# Patient Record
Sex: Male | Born: 1985 | Race: White | Hispanic: Yes | Marital: Married | Smoking: Former smoker
Health system: Southern US, Community
[De-identification: ages and names within clinical notes are randomized; demographics above are authoritative.]

---

## 2006-08-13 ENCOUNTER — Emergency Department (HOSPITAL_COMMUNITY): Admission: EM | Admit: 2006-08-13 | Discharge: 2006-08-14 | Payer: Self-pay | Admitting: Emergency Medicine

## 2006-09-16 ENCOUNTER — Inpatient Hospital Stay (HOSPITAL_COMMUNITY): Admission: EM | Admit: 2006-09-16 | Discharge: 2006-09-20 | Payer: Self-pay | Admitting: Emergency Medicine

## 2006-11-07 ENCOUNTER — Inpatient Hospital Stay (HOSPITAL_COMMUNITY): Admission: AD | Admit: 2006-11-07 | Discharge: 2006-11-10 | Payer: Self-pay | Admitting: Psychiatry

## 2006-11-07 ENCOUNTER — Ambulatory Visit: Payer: Self-pay | Admitting: Psychiatry

## 2009-03-08 ENCOUNTER — Emergency Department (HOSPITAL_COMMUNITY): Admission: EM | Admit: 2009-03-08 | Discharge: 2009-03-08 | Payer: Self-pay | Admitting: *Deleted

## 2010-05-11 IMAGING — CR DG ANKLE COMPLETE 3+V*L*
3 series · 3 of 3 positions shown · non-contrast
Comparison: None.

CLINICAL DATA: 22-year-old male status post twisting injury with
lateral ankle and foot pain.

LEFT ANKLE COMPLETE - 3+ VIEW

[t ankle joint ap left]
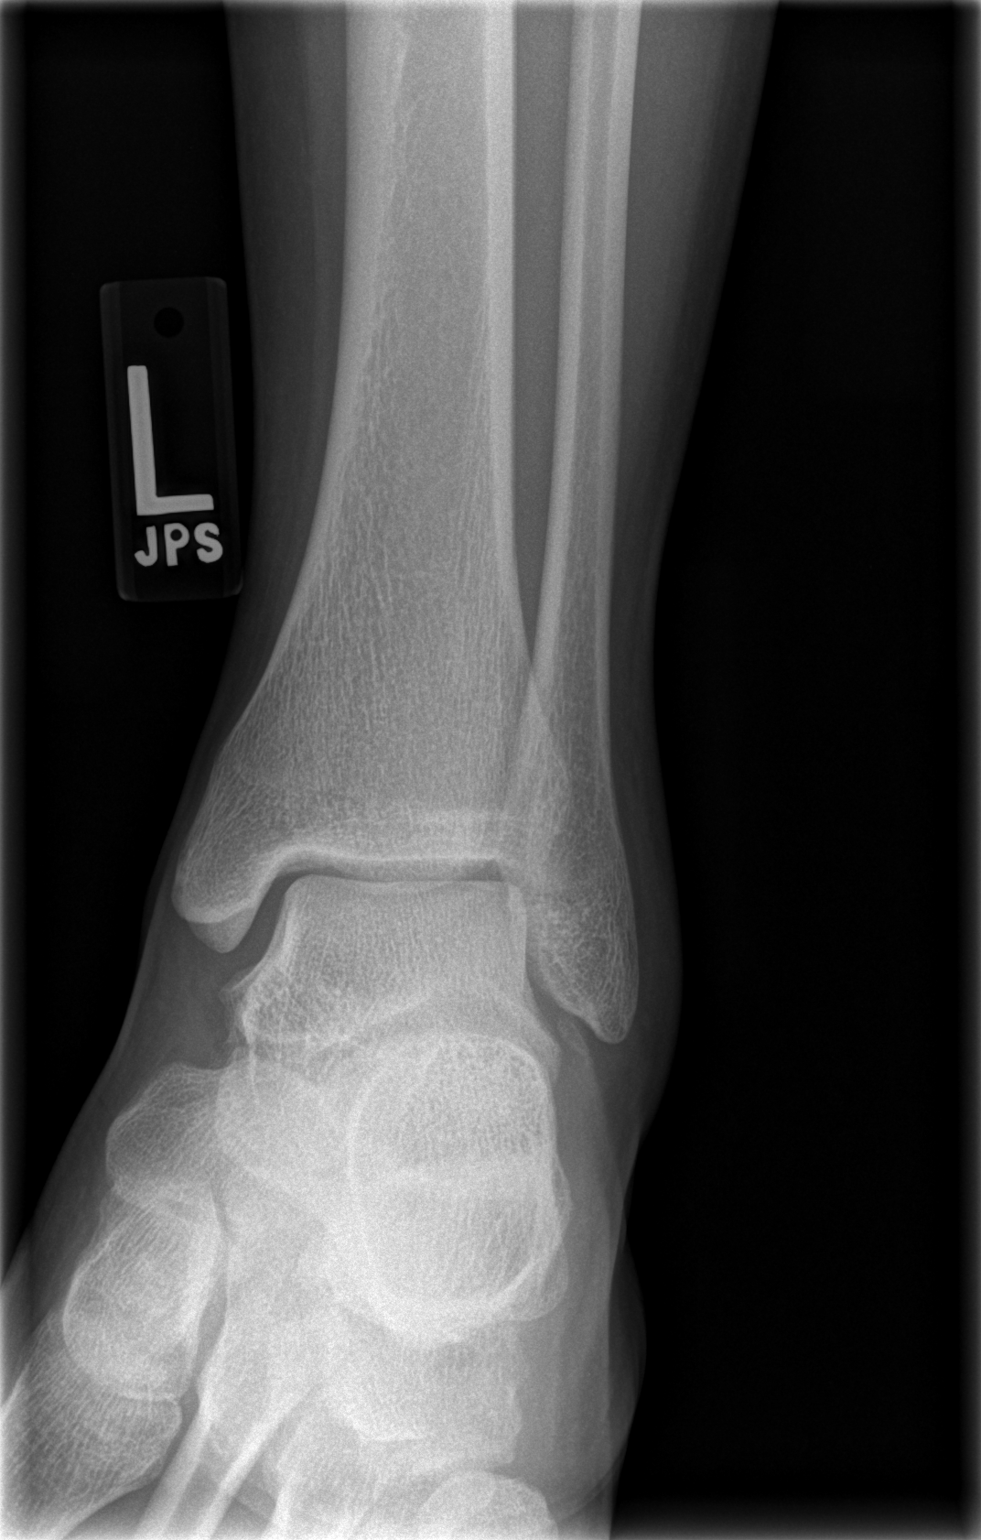

[t ankle joint oblique left]
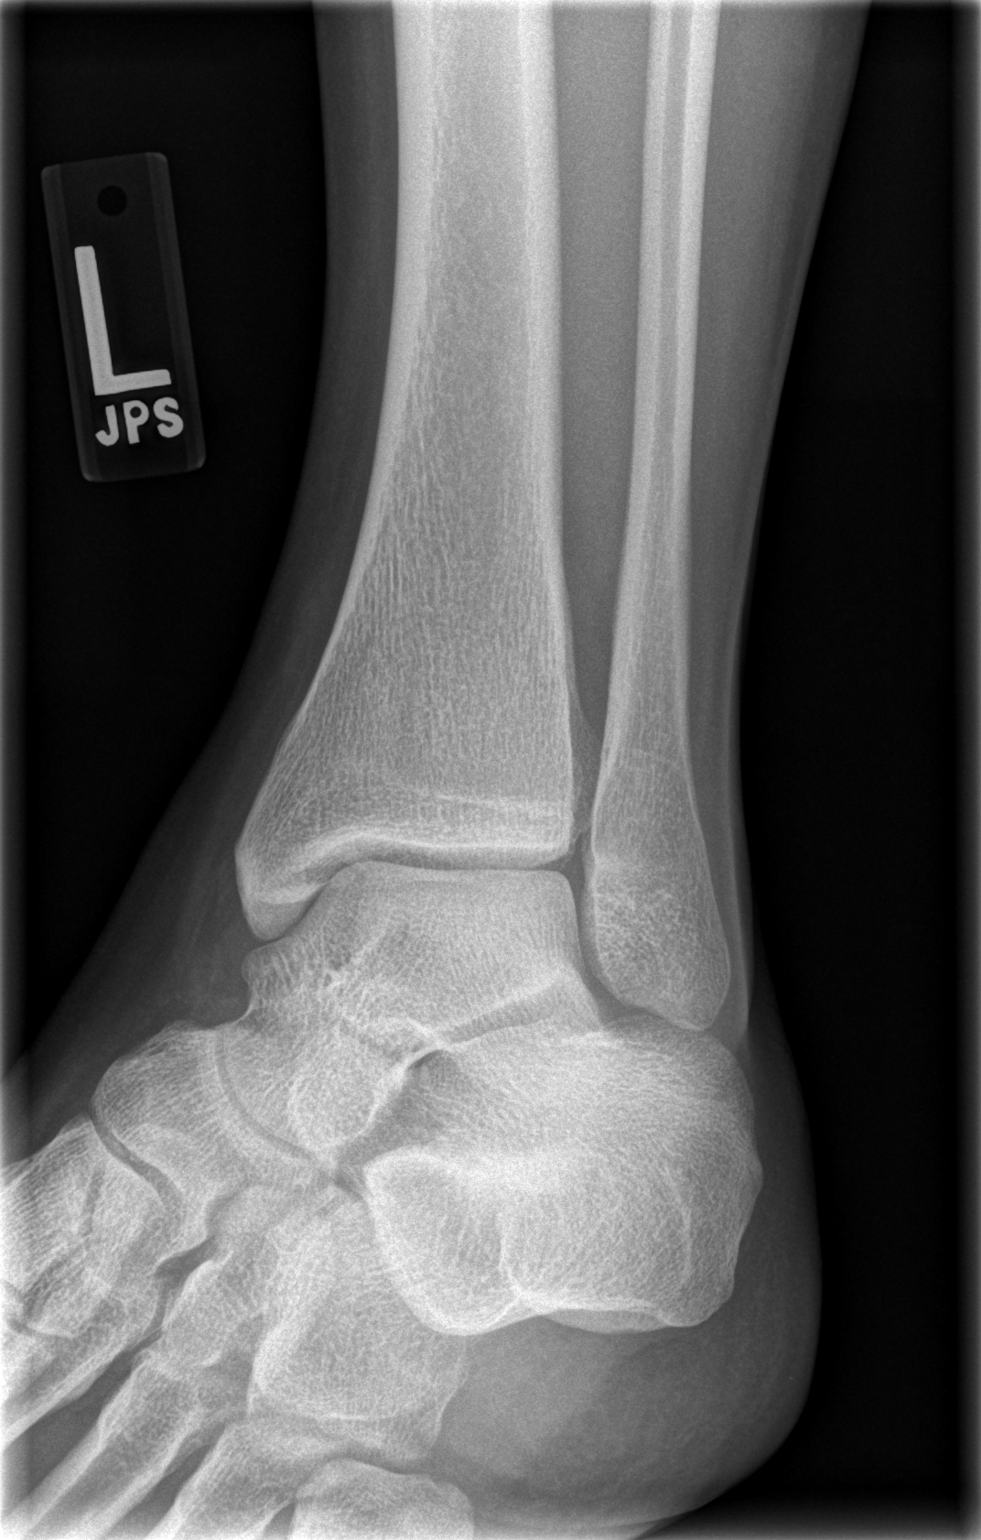

[t ankle joint lat left]
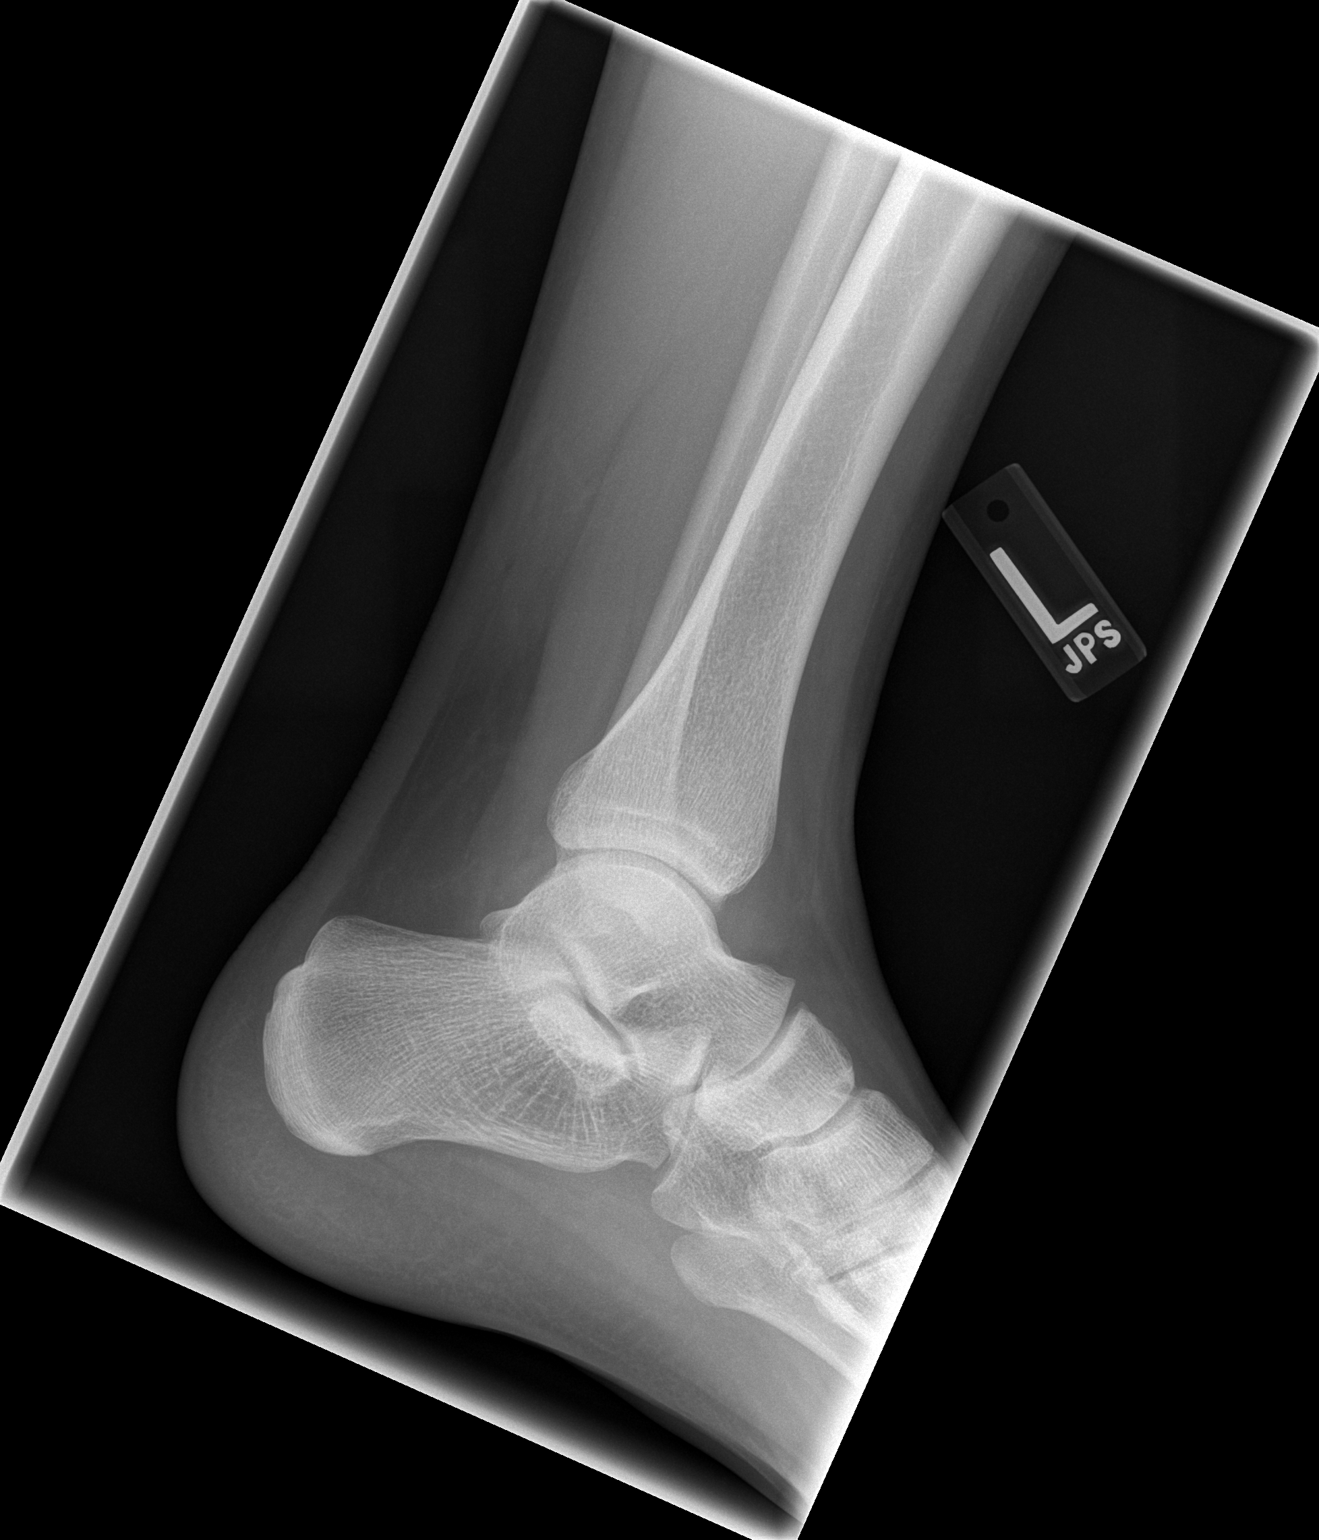

[3 of 3 positions shown; findings below may reference images not displayed]

FINDINGS: Mortise joint alignment is within normal limits.  There
may be a small joint effusion. Bone mineralization is within normal
limits.  The calcaneus is intact.  The talar dome is intact.  The
lateral talus and lateral malleolus appear largely intact, however
there are several age indeterminate ossific fragments located in
the lateral joint space, largest measuring 7 mm.  Visualized
osseous structures of the foot are within normal limits.
IMPRESSION: 1.  Small avulsion fragments in the lateral joint space appear age
indeterminate, and may be acute given the history. Correlate with
history for any prior injury to this area.
2.  Possible small joint effusion.
3.  Otherwise, no acute fracture or dislocation identified about
the left ankle.
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
## 2011-03-30 NOTE — H&P (Signed)
Maurice Thomas, Maurice Thomas NO.:  [id number]   MEDICAL RECORD NO.:  [id number]          PATIENT TYPE:  INP   LOCATION:  1825                         FACILITY:  [facility]   PHYSICIAN:  Lonia Blood, M.D.DATE OF BIRTH:  Feb 25, 1986   DATE OF ADMISSION:  09/16/2006  DATE OF DISCHARGE:                              HISTORY & PHYSICAL   PRIMARY CARE PHYSICIAN:  Unassigned.   CHIEF COMPLAINT:  Suicide attempt.   HISTORY OF PRESENT ILLNESS:  [name] is a 25 year old gentleman  who speaks very limited English; at the present time, however, he is  obtunded, so this does not hamper the exam or history.  No family is  present.  The history is obtained through the emergency room physician.  The patient was seen here approximately 1 month ago with tachy-  palpitations.  He was given Tenormin.  He was felt to be suffering with  an anxiety attack.  He was set up to see a cardiologist; it is not clear  if he made this appointment or not.  Tonight, he was apparently found  unconscious with bottles of Darvocet, an unspecified benzodiazepine and  a beta blocker beside him.  These bottles were reportedly empty.  Pill  counts are not clear and therefore the amount of ingestion is not clear.  Urine drug screen has revealed benzodiazepines.  Tylenol level is toxic  and the patient is bradycardic.  It is therefore felt that he did likely  consume significant quantities of all 3 of these medications.  He has  not vomited.  He remains obtunded at the present time.  He has been  suffering with bradycardia and hypotension during his stay in the  emergency room, necessitating boluses of glucagon.   REVIEW OF SYSTEMS:  Review of systems is not able to be accomplished, as  the patient is obtunded.   PAST MEDICAL HISTORY:  1. Reported past history of hypertension.  2. Supraventricular tachycardia diagnosed in the ER, August 14, 2006.  3. Anxiety disorder, not otherwise specified.   OUTPATIENT MEDICATIONS:  Tenormin 50 mg daily.   ALLERGIES:  No known drug allergies.   FAMILY HISTORY:  Unable to obtain.   SOCIAL HISTORY:  The patient does not smoke, he does not drink and he  does not use illicit drugs per old records.  He speaks limited [place]  per old records.   DATA REVIEW:  White count is elevated at 15,000, platelet count is  320,000, hemoglobin is 16.4 with an MCV of 89.  Sodium is normal,  potassium is elevated at 5.9, chloride is 100, bicarb is 26, BUN is 11,  creatinine is 1.9, glucose is 131, calcium is 9.7, AST is mildly  elevated at 54, ALT is normal, alkaline phosphatase is normal, total  bilirubin is normal, total protein and albumin are normal.  Creatinine  was noted to 0.9, August 14, 2006, and is 1.9 at present.  Urine drug  screen is positive for benzodiazepines, but otherwise unremarkable.  Alcohol level is unremarkable.  Acetaminophen level is 193 and the time  of ingestion is not  clear.  Salicylate level is less than 4.0.  INR is  normal.   PHYSICAL EXAMINATION:  VITAL SIGNS:  Temperature 97.8, blood pressure  75/52 to 122/56, heart rate 56, respiratory rate 20 and O2 SAT is 99% on  room air.  GENERAL:  A well-developed, well-nourished male who is obtunded, but  does not appear to be in respiratory distress.  HEENT:  Normocephalic, atraumatic.  Pupils are constricted and minimally  reactive bilaterally, but symmetric.  OC/OP are clear with moist mucous  membranes.  NECK:  No JVD, no lymphadenopathy, no thyromegaly.  LUNGS:  Clear to auscultation bilaterally without wheeze or rhonchi.  CARDIOVASCULAR:  Bradycardic at approximately 50 beats per minute,  without gallop or rub, with normal S1 and S2.  ABDOMEN:  Nontender and non-distended, soft.  Bowel sounds present.  No  hepatosplenomegaly.  No rebound.  No ascites.  EXTREMITIES:  No significant cyanosis, clubbing or edema of bilateral  lower extremities.  VASCULAR:  Weak 1+ pulses  regularly bilaterally with pulses unable to be  palpated in the dorsalis pedis region bilaterally.  NEUROLOGIC:  The patient is obtunded.  He does not withdraw from pain.  There is no purposeful movement.  Facial features are symmetric.  There  is no posturing and there is no Babinski.   IMPRESSION AND PLAN:  1. Acetaminophen toxicity.  The exact timing of the patient's overdose      on Darvocet is not clear.  He does have, however, a sufficiently      elevated acetaminophen level that he will require treatment.  We      will dose the patient with intravenous acetylcysteine, as we are      unable to reliably provide him with oral.  I am hesitant to place      an nasogastric tube at the present time, due to a high risk for      stimulated vomiting.  Liver function tests will be monitored, as      will coagulations.  2. Beta blocker overdose.  The patient is indeed suffering with      significant beta blocker toxicity.  He is bradycardic and      hypotensive.  He will be hydrated aggressively.  Glucagon infusion      will be provided via continuous drip.  3. Suicide attempt.  The patient will be kept on suicide precautions      with 24-hour sitter.  Psychiatry will be consulted, once the      patient is alert and medically cleared for psychiatric disposition.  4. Acute renal insufficiency.  The patient has a confirmed baseline      creatinine of 0.9 dating back to October 2007.  His present      creatinine of 1.9 indicates a significant decrease in glomerular      filtration rate.  Hopefully, this is simply a volume issue, but      there is a significant risk of acute tubular necrosis related to      the patient's hypotension due to his beta blocker toxicity.  We      will follow his renal function closely.  We will place a Foley and      ensure that his urine output is consistent.      Lonia Blood, M.D.  Electronically Signed    JTM/MEDQ  D:  09/16/2006  T:  09/16/2006   Job:  [id number]

## 2011-03-30 NOTE — Discharge Summary (Signed)
Maurice Thomas, Maurice Thomas                ACCOUNT NO.:  [id number]   MEDICAL RECORD NO.:  [id number]          PATIENT TYPE:  INP   LOCATION:  2924                         FACILITY:  [facility]   PHYSICIAN:  Lonia Blood, M.D.      DATE OF BIRTH:  July 19, 1986   DATE OF ADMISSION:  09/16/2006  DATE OF DISCHARGE:  09/19/2006                                 DISCHARGE SUMMARY   PRIMARY CARE PHYSICIAN:  The patient is unassigned.   DISCHARGE DIAGNOSES:  1. Intentional overdose with suicide attempt.  2. Hypokalemia, transiently.  3. Supraventricular tachycardia.  4. Possible seizure from overdose.   DISCHARGE MEDICATIONS:  1. Lexapro 10 mg daily.  2. Metoprolol 12.5 mg p.o. b.i.d.  3. Dilantin 100 mg t.i.d.   DISPOSITION:  The patient agreed to inpatient psychiatric treatment.  We  will discharge him to inpatient psychiatry, probably Behavioral Health.   PROCEDURE PERFORMED THIS ADMISSION:  1. Chest x-ray on September 16, 2006 that showed suggestion of retrocardiac      airspace disease on the current film.  A dedicated chest film later      said apparent cardiomegaly without congestive heart failure.  2. A CT of head without contrast on that day showed no acute findings.      There is mild sphenoid sinus disease.  3. MRI/MRA of the brain on September 18, 2006 showed scattered sinus      disease, no intracranial abnormality.  The MRI showed vascular      irregularity, distal middle cerebral artery and posterior cerebral      artery branches which could represent vasculitis potentially related to      drug use or autoimmune disease.  MR angiography was thought to be      nonspecific.  No significant branch vessel occlusion or aneurysm.   CONSULTATIONS:  Dr. Sandria Manly, [facility].  Pending workup  and EEG to be done prior to the patient's discharge.   BRIEF HISTORY AND PHYSICAL:  Please refer to dictated history and physical  on admission by Dr. Jetty Duhamel.  In short, however,  this is a 25-year-  old Hispanic with limited English, who was brought in obtunded secondary to  suicide attempt.  He has had prior admission with tachy-palpitations.  At  that time he was given Tenormin.  He also was having some anxiety attack at  the time.  On the day of admission, the patient was found unconscious with  bottles of Darvocet and also benzodiazepines, as well as beta blockers  beside him and they were all empty.  It was not clear how much he took.  His  urine drug screen showed mainly benzodiazepines and high level of Tylenol,  which is toxic.  The patient was also bradycardic on admission, indicating  that he probably took all of those medications combined.  He was  subsequently admitted to the ICU for further management.   HOSPITAL COURSE:  PROBLEM #1 - DRUG OVERDOSE:  This included acetaminophen  and beta blockers.  His heart rate was 56 on admission.  Blood pressure was  75/52.  His acetaminophen level was markedly elevated; the initial level was  193.  The time of ingestion was not clear, so the patient was started on  Mucomyst protocol, which he is concluding today.  His LFTs were also  elevated; initially, AST was 54 and alkaline phosphatase was normal, but  later it rose.  Salicylate level was less than 5.  The patient also received  glucagon for the beta blocker toxicity.  He was hydrated aggressively.  He  responded very well to this initial treatment.  He is still suicidal and has  received psychiatric consult.  The patient requires inpatient treatment and  is going to be moved to an inpatient psychiatric unit.   PROBLEM #2 - BETA BLOCKER OVERDOSE:  As indicated above, the patient  received glucagon in addition to hydration and his heart rate has since  returned to high with supraventricular tachycardia.  We have restarted him  on low-dose Toprol.  Hopefully, once his antidepressants kick in and he  leaves inpatient psychiatry, he will be back to his normal  self.   PROBLEM #3 - SUICIDE ATTEMPT:  As indicated above, the patient is probably  severely depressed.  He is still suicidal in a way; as such, we will proceed  with psychiatric recommendation for inpatient psychiatry.  In the meantime,  he has been started on Lexapro and further titration will probably happen as  an inpatient.   PROBLEM #4 - POSSIBLE SEIZURE:  The patient had what appears to be a seizure  on admission, probably related to his toxicity.  With this in mind, we are  getting a neurology consult as well and he has an MRI/MRA as well as EEG  pending.  He was started on Dilantin for the seizure.  He will have followup  with Dr. Sandria Manly as an outpatient for further management.   PROBLEM #5 - HYPOKALEMIA:  This was transient with his potassium dropping to  3.3 at some point and that was repleted.   Otherwise, the patient is currently stable from our standpoint to be  transferred to inpatient psychiatry.      Lonia Blood, M.D.  Electronically Signed     LG/MEDQ  D:  09/19/2006  T:  09/19/2006  Job:  [id number]

## 2011-03-30 NOTE — Procedures (Signed)
EEG NUMBER:  [id number].   HISTORY:  This is a 25 year old with benzodiazepine overdose with altered  mental and seizures.  The patient is having EEG done to evaluate for seizure  activity.   PROCEDURE:  This is a routine EEG.   TECHNICAL DESCRIPTION:  Throughout this routine EEG there is a posterior  dominant rhythm of 9 and 10 Hz activity at 20-30 microvolts.  The background  activity is symmetric and mostly comprised of alpha range activity at 15-30  microvolts.  Occasionally noticed in the background there is EMG and  movement artifact that occasionally obscures the background.  With photic  stimulation, there is symmetric photic driving response noted.  Hyperventilation does not produce any significant abnormalities.  The  patient does become drowsy and eventually falls into stage II sleep with the  appearance of symmetric vertex waves and sleep spindles.  Throughout this  record there is no evidence of epileptiform activity.   IMPRESSION:  This routine EEG is within normal limits in the awake and sleep  states.      Bevelyn Buckles. Nash Shearer, M.D.  Electronically Signed     ZOX:[id number]  D:  09/18/2006 10:26:58  T:  09/18/2006 14:41:45  Job #:  [id number]

## 2011-03-30 NOTE — Consult Note (Signed)
NAME:  Maurice Thomas NO.:  [id number]   MEDICAL RECORD NO.:  [id number]          PATIENT TYPE:  IPS   LOCATION:  NA                            FACILITY:  [facility]   PHYSICIAN:  Antonietta Breach, M.D.  DATE OF BIRTH:  Feb 19, 1986   DATE OF CONSULTATION:  09/20/2006  DATE OF DISCHARGE:                                   CONSULTATION   PSYCHIATRIC CONSULTATION   DATE OF CONSULTATION:  September 20, 2006.   REQUESTING PHYSICIAN:  Lawal Garba.   REASON FOR CONSULTATION:  Severe depression with suicide attempt.   HISTORY OF PRESENT ILLNESS:  Maurice Thomas is a 25 year old male, who was  admitted to the [facility] on September 16, 2006 after a suicide  attempt with an overdose of medication.  He was found unconsciousness with  bottles of Darvocet present, also a beta blocker bottle was present, when he  was admitted his Tylenol level was toxic and he __________.   The patient required an interpreter for his visits.  He had been  experiencing some stress over his family rejecting his girlfriend.  He  developed depressed mood and anhedonia and suicidal thoughts.  He already  had a history of excess worry and feeling on edge, having been treated with  benzodiazepine therapy.  He took an overdose in an attempt to kill himself.   PAST PSYCHIATRIC HISTORY:  There is no history of prior suicide attempts, no  history of increased energy and decreased need for sleep.  No history of  hallucinations or delusions.  There is a history of excess worry and feeling  on edge as well as muscle tension being treated with benzodiazepine therapy.  There is no known history of psychiatric hospitalization or prior treatment  of depression with antidepressant.   FAMILY PSYCHIATRIC HISTORY:  None known.   SOCIAL HISTORY:  The patient is not married.  He has no children.  He does  have a girlfriend whom he is very attached to and cares for.  He denies  illegal drugs or alcohol.  He does  speak limited [place].   OCCUPATION:  He works and is employed by a company.   GENERAL MEDICAL PROBLEMS:  1. History of hypertension.  2. History of supraventricular tachycardia diagnosed in October of this      year.   MEDICATIONS:  The MAR is reviewed.  The patient was started on Lexapro by  the undersign this week.  He is not having any adverse effects.  Please see  the review of systems.  He is not on other psychotropic medication.   ALLERGIES:  NO KNOWN DRUG ALLERGIES.   LABORATORY DATA:  The patient had a head CT without contrast that showed no  acute findings except for mild sphenoid sinus disease.  An MRI of the brain  showed scattered sinus disease but no intracranial abnormality.  There was  concern about autoimmune disease on the examination of the cerebral  vasculature.  The patient is under consultation with [facility] due to the MRI findings.   LABORATORY DATA:  White  blood cell count 10.3, hemoglobin 14, platelet count  280.  INR 1.3.  Metabolic panel shows the SGOT decreased from 92 now down to  75.  The SGPT decreased from 108 down to 92.  Albumin is 3.3.  Tylenol level  was negative.  Aspirin was negative.  The urine drug screen was positive for  benzodiazepines.  Alcohol negative.   REVIEW OF SYSTEMS:  CONSTITUTIONAL:  Head:  No trauma.  Eyes:  No visual  changes.  Ears:  No hearing impairment.  Nose:  No rhinorrhea.  Mouth/throat:  No sore throat.  NEUROLOGIC:  As above.  PSYCHIATRIC:  As  above.  CARDIOVASCULAR:  No chest pain.  RESPIRATORY:  No coughs.  GASTROINTESTINAL:  No nausea or vomiting or diarrhea.  GENITOURINARY:  No  dysuria.  SKIN:  Unremarkable.  ENDOCRINE/METABOLIC:  Unremarkable.  MUSCULOSKELETAL:  No deformities.  HEMATOLOGIC/LYMPHATIC:  No anemia.   EXAMINATION:  VITAL SIGNS:  The patient is afebrile, vital signs stable.  MENTAL STATUS EXAM:  Mr. Toth is alert.  The exam is done with an  interpreter.  He is a young  male, sitting in a partially reclined supine  position in his hospital bed, no apparent distress.  He looks his stated  age.  His psychomotor tone is within normal limits.  He is oriented to all  spheres.  His memory is intact to immediate, recent, remote.  His fund of  knowledge and intelligence are within normal limits.  His speech involves  normal rate and prosody.  Thought process is logical, coherent, goal-  directed, no  looseness of association.  Thought content:  No thoughts of  harming himself.  No thoughts of harming others.  No delusions.  No  hallucinations.  Memory is intact to immediate, recent, remote.  Concentration is within normal limits.  Affect is mildly constricted to  baseline with a broad appropriate response.  Insight is good.  Judgment is  intact.   ASSESSMENT:  Axis I:  Mood disorder, not otherwise specified, depressed:  293.83 (some general medical factors along with functional factors are  evident).  Major depressive disorder, single episode.  293.84:  Anxiety disorder not otherwise specified.  Axis II:  Deferred.  Axis III:  See general medical problems.  Axis IV:  General medical and primary support group.  Axis V:  55.   The undersign did recommend further hospitalization in a psychiatric  hospital to provide more intensive level of therapy given that the patient  has had some difficulty with his social environment and their opinion of his  girlfriend.   However, the patient declined inpatient hospitalization and he is motivated  for outpatient therapy.  He is no longer committable now after being in the  general medical hospital for a number of days and receiving support.  Also,  he mentions that his family has now changed their mind regarding his  girlfriend and is accepting her.  The patient describes a normal level of  interest and the positive goals for the future.  He has a constructive level of regret about his overdose and a desire to  strengthen himself further  regarding stresses in his life and coping skills.   RECOMMENDATION:  1. Continue Lexapro 10 mg q.a.m. for anti-depression.  2. We will work with the case manager in finding a Hispanic therapist as      well as hopefully a psychiatrist to prescribe the medication, who is      Spanish speaking as well.  3. The patient agrees to call emergency services immediately for any      thoughts of harming himself, thoughts of      harming others, or distress.  He is no longer at risk to harm himself.  4. The patient could benefit from increased coping skills, stress      management as well as cognitive behavioral therapy potentially      involving deep breathing and muscle relaxation for his anxiety history.      Antonietta Breach, M.D.  Electronically Signed     JW/MEDQ  D:  09/29/2006  T:  09/30/2006  Job:  [id number]

## 2011-03-30 NOTE — Consult Note (Signed)
Maurice Thomas, Maurice Thomas NO.:  [id number]   MEDICAL RECORD NO.:  [id number]          PATIENT TYPE:  INP   LOCATION:  2924                         FACILITY:  [facility]   PHYSICIAN:  Genene Churn. Love, M.D.    DATE OF BIRTH:  November 27, 1985   DATE OF CONSULTATION:  09/17/2006  DATE OF DISCHARGE:                                   CONSULTATION   REFERRING PHYSICIAN:  [facility].   This 25 year old right-handed [place] male, who speaks no English, is seen  in consult to [facility], for evaluation of suspected three seizures.   HISTORY OF PRESENT ILLNESS:  Maurice Thomas had been seen at the [facility] emergency room with a tachycardia in October 2007, at which time he  was placed on Tenormin.  He has also been followed by a physician in  [place]. Is on 2 other medications, one for high blood  pressure and a benzodiazepine.  He has been depressed recently for reasons  not known by his cousin, Maurice Thomas.  He was admitted to [facility] on  September 16, 2006, having been found obtunded with three empty bottles of  medication by his side.  These apparently included a beta blocker, an  antihypertensive, and a benzodiazepine.  Urine drug screen was positive for  benzodiazepine.  The patient does not drink alcohol, does not smoke  cigarettes, nor does he use illicit drugs.  He was admitted to the hospital  and placed on glucagon for possible beta blocker overdose and subsequently  transferred to 2000 telemetry floor on September 17, 2006.  There he had 3  episodes in which he was combative, breathing very quickly, was unresponsive  and unable to come to.  These were thought to represent seizures.  They  apparently lasted minutes but I have no better description.  He was treated  with IV Ativan and, subsequently, has been loaded with fosphenytoin.  The  patient has no known history of head trauma. By telephone, Maurice Thomas, his  cousin, is able to  communicate with him.  He states that he is not oriented,  does not know where he is, does not know the year, and does not know the  month.   PAST MEDICAL HISTORY:  Is significant only for hypertension, anxiety, and  tachycardia.  He was thought to have APAP  toxicity when he was admitted to  the hospital.  He was on 3 medications that are unknown.  One is Tenormin,  one is an hypertensive, and one is a benzodiazepine.  He apparently had been  only on these medicines for a month, but this is not for certain either.   EXAMINATION:  Revealed a well-developed male in no acute distress.  Blood  pressure on the left 130/80, heart rate was 96, he had a left  supraclavicular bruit. He was alert but not oriented to person, place, or  year, through his interpreter by telephone, Maurice Thomas.  He did not follow  commands verbally but would mimic commands.  CRANIAL NERVE EXAMINATION:  Revealed that his discs were flat.  He blinked  to scare in both eyes.  His face was symmetric.  Tongue was midline.  Uvula  was midline.  Gags were present.  His motor examination with good strength  in the upper extremities.  Deep tendon reflexes were 1 to 2+.  Plantar  responses were bilaterally downgoing. He felt pinprick in all extremities.  He had a chipped tooth in the front and a chipped tooth in the back of his  mouth but no tongue biting.   IMPRESSION:  1. Suspected 3 seizures. Code 245.10  2. Drug overdose, possibly benzodiazepines, a beta blocker, and an unknown      antihypertensive.   Plan at this time was to obtain a computerized tomography scan of the brain  on an urgent basis and then an magnetic resonance imaging study, magnetic  resonance angiogram, and electroencephalogram.  He will be maintained on  Dilantin and a phenytoin level be obtained.  He will be maintained on  seizure precautions.           ______________________________  Genene Churn. Sandria Manly, M.D.     JML/MEDQ  D:  09/17/2006  T:   09/18/2006  Job:  [id number]

## 2012-07-28 ENCOUNTER — Encounter (HOSPITAL_COMMUNITY): Payer: Self-pay | Admitting: *Deleted

## 2012-07-28 ENCOUNTER — Emergency Department (HOSPITAL_COMMUNITY)
Admission: EM | Admit: 2012-07-28 | Discharge: 2012-07-28 | Disposition: A | Discharge status: Home / Self Care | Payer: Self-pay | Attending: Emergency Medicine | Admitting: Emergency Medicine

## 2012-07-28 DIAGNOSIS — N498 Inflammatory disorders of other specified male genital organs: Secondary | ICD-10-CM | POA: Insufficient documentation

## 2012-07-28 DIAGNOSIS — N492 Inflammatory disorders of scrotum: Secondary | ICD-10-CM

## 2012-07-28 DIAGNOSIS — F17200 Nicotine dependence, unspecified, uncomplicated: Secondary | ICD-10-CM | POA: Insufficient documentation

## 2012-07-28 MED ORDER — SULFAMETHOXAZOLE-TRIMETHOPRIM 800-160 MG PO TABS: 1.0000 | ORAL_TABLET | Freq: Two times a day (BID) | ORAL | Status: AC

## 2012-07-28 MED ORDER — SULFAMETHOXAZOLE-TMP DS 800-160 MG PO TABS
1.0000 | ORAL_TABLET | Freq: Once | ORAL | Status: AC
  Administered 2012-07-28: 1 via ORAL
  Filled 2012-07-28: qty 1

## 2012-07-28 MED ORDER — NAPROXEN 500 MG PO TABS: 500.0000 mg | ORAL_TABLET | Freq: Two times a day (BID) | ORAL | Status: DC

## 2012-07-28 MED ORDER — OXYCODONE-ACETAMINOPHEN 5-325 MG PO TABS: 1.0000 | ORAL_TABLET | ORAL | Status: AC | PRN

## 2012-07-28 NOTE — ED Notes (Signed)
INCISION AND DRAINAGE TRAY SET UP AT BEDSIDE WITH LIDOCAINE VIALS / IODOFORM PACKING GAUZE.

## 2012-07-28 NOTE — ED Notes (Signed)
C/o "a little ball on his penis", describes as painful. First noticed 2d ago,

## 2012-07-28 NOTE — ED Provider Notes (Signed)
History     CSN: [id number]  Arrival date & time 07/28/12  4098   First MD Initiated Contact with Patient 07/28/12 0401      Chief Complaint  Patient presents with  . Penis Pain    (Consider location/radiation/quality/duration/timing/severity/associated sxs/prior treatment) HPI Comments: Pt states that 3 days ago he popped a pimple just posterior to his scrotum and then had gradual onset of persistent pain and swelling in that are which is severe, ttp, and not associated with fevers.  He has not had any evaluation prior to today.  Denis DM or Fever or HIV  Patient is a 26 y.o. male presenting with penile pain. The history is provided by the patient.  Penis Pain This is a new problem.    History reviewed. No pertinent past medical history.  History reviewed. No pertinent past surgical history.  No family history on file.  History  Substance Use Topics  . Smoking status: Current Every Day Smoker  . Smokeless tobacco: Not on file  . Alcohol Use: No      Review of Systems  Constitutional: Negative for fever.  Gastrointestinal: Negative for nausea and vomiting.  Genitourinary: Positive for scrotal swelling. Negative for penile pain and testicular pain.    Allergies  Review of patient's allergies indicates no known allergies.  Home Medications   Current Outpatient Rx  Name Route Sig Dispense Refill  . NAPROXEN 500 MG PO TABS Oral Take 1 tablet (500 mg total) by mouth 2 (two) times daily with a meal. 30 tablet 0  . OXYCODONE-ACETAMINOPHEN 5-325 MG PO TABS Oral Take 1 tablet by mouth every 4 (four) hours as needed for pain. 20 tablet 0  . SULFAMETHOXAZOLE-TRIMETHOPRIM 800-160 MG PO TABS Oral Take 1 tablet by mouth every 12 (twelve) hours. 20 tablet 0    BP 133/74  Pulse 102  Temp 98.3 F (36.8 C) (Oral)  Resp 18  SpO2 98%  Physical Exam  Nursing note and vitals reviewed. Constitutional: He appears well-developed and well-nourished. No distress.  HENT:  Head:  Normocephalic and atraumatic.  Eyes: Conjunctivae normal are normal. No scleral icterus.  Cardiovascular: Normal rate and regular rhythm.   Pulmonary/Chest: Effort normal.  Abdominal: Soft. Bowel sounds are normal.  Genitourinary:          Has large fluctuant indurated abscess posterior to scrotum anteiror to anus.  Penis is normal apearing, uncirc and no d/c or lesions, scrotum appears normal without redness or tenderness  Neurological: He is alert. Coordination normal.  Skin: Skin is warm and dry. He is not diaphoretic.       Abscess as described above    ED Course  Procedures (including critical care time)  Labs Reviewed - No data to display No results found.   1. Abscess of scrotum       MDM  Abscess present, confirmed on [place] on my bedside exam, I and D.   INCISION AND DRAINAGE Performed by: Eber Hong D Consent: Verbal consent obtained. Risks and benefits: risks, benefits and alternatives were discussed Type: abscess  Body area: perineum between scrotum and anus  Anesthesia: local infiltration  Local anesthetic: lidocaine 1% without epinephrine  Anesthetic total: 2 ml  Complexity: complex Blunt dissection to break up loculations  Drainage: purulent  Drainage amount: large - then irrigated copiously  Packing material: 1/4 in iodoform gauze  Patient tolerance: Patient tolerated the procedure well with no immediate complications.        Home on naprosyn, Bactrim, Percocet.  Culture  obtained.  Urology f/u recommended, ER for wrosening sx.  Vida Roller, MD 07/28/12 [phone]

## 2012-07-31 LAB — CULTURE, ROUTINE-ABSCESS

## 2012-08-01 NOTE — ED Notes (Signed)
I/D done-treated with Bactrim.

## 2012-08-04 NOTE — ED Notes (Signed)
Chart returned from EDP office . Please have patient follow up with urology. Return to ED for urinary symptoms. If still having symptons ,must see PCP,urology or ED for recheck per [facility].

## 2012-08-06 ENCOUNTER — Telehealth (HOSPITAL_COMMUNITY): Payer: Self-pay | Admitting: *Deleted

## 2012-08-06 NOTE — ED Notes (Signed)
Letter sent to home address

## 2013-04-12 ENCOUNTER — Emergency Department (HOSPITAL_COMMUNITY)
Admission: EM | Admit: 2013-04-12 | Discharge: 2013-04-12 | Disposition: A | Discharge status: Home / Self Care | Payer: Self-pay | Attending: Emergency Medicine | Admitting: Emergency Medicine

## 2013-04-12 ENCOUNTER — Encounter (HOSPITAL_COMMUNITY): Payer: Self-pay | Admitting: *Deleted

## 2013-04-12 DIAGNOSIS — L02215 Cutaneous abscess of perineum: Secondary | ICD-10-CM

## 2013-04-12 DIAGNOSIS — L02219 Cutaneous abscess of trunk, unspecified: Secondary | ICD-10-CM | POA: Insufficient documentation

## 2013-04-12 DIAGNOSIS — F17200 Nicotine dependence, unspecified, uncomplicated: Secondary | ICD-10-CM | POA: Insufficient documentation

## 2013-04-12 MED ORDER — AMOXICILLIN-POT CLAVULANATE 875-125 MG PO TABS: 1.0000 | ORAL_TABLET | Freq: Two times a day (BID) | ORAL | Status: DC

## 2013-04-12 MED ORDER — OXYCODONE-ACETAMINOPHEN 5-325 MG PO TABS: 1.0000 | ORAL_TABLET | Freq: Four times a day (QID) | ORAL | Status: DC | PRN

## 2013-04-12 NOTE — ED Notes (Signed)
Pt here for pain behind testicles, sts started yeseterday and pain is worse with walking.

## 2013-04-12 NOTE — ED Provider Notes (Signed)
History     CSN: [id number]  Arrival date & time 04/12/13  0325   First MD Initiated Contact with Patient 04/12/13 0422      Chief Complaint  Patient presents with  . painful testicle     (Consider location/radiation/quality/duration/timing/severity/associated sxs/prior treatment) The history is provided by the patient.   patient's had pain behind his scrotum for the last few days. His been swelling. He states his aunt gave him something to treat it that was from plan. He had a previous abscess at the site. No fevers. No dysuria. No drainage.  History reviewed. No pertinent past medical history.  History reviewed. No pertinent past surgical history.  No family history on file.  History  Substance Use Topics  . Smoking status: Current Every Day Smoker  . Smokeless tobacco: Not on file  . Alcohol Use: No      Review of Systems  Constitutional: Negative for fever and chills.  Gastrointestinal: Negative for abdominal pain.  Genitourinary: Negative for frequency, flank pain, discharge, penile swelling, scrotal swelling, difficulty urinating and genital sores.       Painful perineal mass  Musculoskeletal: Negative for gait problem.  Skin: Positive for wound. Negative for rash.    Allergies  Review of patient's allergies indicates no known allergies.  Home Medications   Current Outpatient Rx  Name  Route  Sig  Dispense  Refill  . amoxicillin-clavulanate (AUGMENTIN) 875-125 MG per tablet   Oral   Take 1 tablet by mouth 2 (two) times daily.   10 tablet   0   . oxyCODONE-acetaminophen (PERCOCET/ROXICET) 5-325 MG per tablet   Oral   Take 1-2 tablets by mouth every 6 (six) hours as needed for pain.   10 tablet   0     BP 114/56  Pulse 86  Temp(Src) 98.1 F (36.7 C) (Oral)  Resp 16  SpO2 99%  Physical Exam  Constitutional: He appears well-developed and well-nourished.  HENT:  Head: Normocephalic.  Cardiovascular: Normal rate.   Abdominal: There is no  tenderness.  Genitourinary: Penis normal.  Mass posterior to scrotum. Approximately 3 cm. It is fluctuant. It is tender. No erythema. No scrotal involvement. No anal involvement.    ED Course  Procedures (including critical care time)  Labs Reviewed - No data to display No results found.   1. Perineal abscess    INCISION AND DRAINAGE Performed by: Billee Cashing. Consent: Verbal consent obtained. Risks and benefits: risks, benefits and alternatives were discussed Type: abscess  Body area: perineum  Anesthesia: local infiltration  Incision was made with a scalpel.  Local anesthetic: lidocaine 2% without epinephrine  Anesthetic total: 2ml  Patient had a stab incision with a scalpel, there was mild bloody drainage, however no purulence drainage. Patient tolerance: Patient tolerated the procedure well with no immediate complications.     MDM  Patient with perineal swelling. Previous abscess site. The swelling has become more painful and larger recently. I and D. Attempted, however no pertinent drainage. We'll treat with antibiotics, warm soaks and have follow with urology        Juliet Rude. Rubin Payor, MD 04/12/13 [phone]

## 2013-04-12 NOTE — ED Notes (Signed)
NAD noted at time of d/c home 

## 2013-04-12 NOTE — ED Notes (Signed)
The pt reports that he has a lump on his testicle since yesterday.  He has not had any injury

## 2013-09-10 ENCOUNTER — Encounter (HOSPITAL_COMMUNITY): Payer: Self-pay | Admitting: Emergency Medicine

## 2013-09-10 ENCOUNTER — Emergency Department (HOSPITAL_COMMUNITY)
Admission: EM | Admit: 2013-09-10 | Discharge: 2013-09-10 | Disposition: A | Discharge status: Home / Self Care | Payer: Self-pay | Attending: Emergency Medicine | Admitting: Emergency Medicine

## 2013-09-10 DIAGNOSIS — K0889 Other specified disorders of teeth and supporting structures: Secondary | ICD-10-CM

## 2013-09-10 DIAGNOSIS — F17200 Nicotine dependence, unspecified, uncomplicated: Secondary | ICD-10-CM | POA: Insufficient documentation

## 2013-09-10 DIAGNOSIS — K089 Disorder of teeth and supporting structures, unspecified: Secondary | ICD-10-CM | POA: Insufficient documentation

## 2013-09-10 MED ORDER — IBUPROFEN 600 MG PO TABS: 600.0000 mg | ORAL_TABLET | Freq: Four times a day (QID) | ORAL | Status: DC | PRN

## 2013-09-10 MED ORDER — OXYCODONE-ACETAMINOPHEN 5-325 MG PO TABS: 2.0000 | ORAL_TABLET | Freq: Once | ORAL | Status: DC

## 2013-09-10 MED ORDER — PENICILLIN V POTASSIUM 500 MG PO TABS: 500.0000 mg | ORAL_TABLET | Freq: Three times a day (TID) | ORAL | Status: DC

## 2013-09-10 NOTE — ED Notes (Signed)
Pt. reports persistent left upper molar pain onset yesterday unrelieved by OTC Tylenol .

## 2013-09-10 NOTE — ED Provider Notes (Signed)
Medical screening examination/treatment/procedure(s) were performed by non-physician practitioner and as supervising physician I was immediately available for consultation/collaboration.    Maitland Lesiak M Tasheba Henson, MD 09/10/13 0742 

## 2013-09-10 NOTE — ED Provider Notes (Signed)
CSN: [id number]     Arrival date & time 09/10/13  0238 History   First MD Initiated Contact with Patient 09/10/13 [phone]     Chief Complaint  Patient presents with  . Dental Pain   (Consider location/radiation/quality/duration/timing/severity/associated sxs/prior Treatment) HPI Comments: Patient states she's her dentist.  Last week.  As a followup appointment.  Next week.  Presents tonight with pain, although he's been soundly sleeping in the emergency department.  Since arrival.  He, states his friend gave him some medication that helps pain, although he does not know what this is  Patient is a 27 y.o. male presenting with tooth pain. The history is provided by the patient.  Dental Pain Location:  Upper Upper teeth location:  14/LU 1st molar Quality:  Unable to specify Severity:  Moderate Onset quality:  Unable to specify Timing:  Intermittent Chronicity:  New Associated symptoms: no fever and no headaches     History reviewed. No pertinent past medical history. History reviewed. No pertinent past surgical history. No family history on file. History  Substance Use Topics  . Smoking status: Current Every Day Smoker  . Smokeless tobacco: Not on file  . Alcohol Use: No    Review of Systems  Constitutional: Negative for fever and chills.  HENT: Positive for dental problem.   Neurological: Negative for dizziness and headaches.  All other systems reviewed and are negative.    Allergies  Review of patient's allergies indicates no known allergies.  Home Medications   Current Outpatient Rx  Name  Route  Sig  Dispense  Refill  . ibuprofen (ADVIL,MOTRIN) 600 MG tablet   Oral   Take 1 tablet (600 mg total) by mouth every 6 (six) hours as needed for pain.   30 tablet   0   . penicillin v potassium (VEETID) 500 MG tablet   Oral   Take 1 tablet (500 mg total) by mouth 3 (three) times daily.   30 tablet   0    BP 142/81  Pulse 53  Temp(Src) 97.4 F (36.3 C) (Oral)   Resp 18  Wt 195 lb 12.8 oz (88.814 kg)  SpO2 98% Physical Exam  Nursing note and vitals reviewed. Constitutional: He appears well-developed and well-nourished.  HENT:  Head: Normocephalic.  Mouth/Throat: Oropharynx is clear and moist. No oropharyngeal exudate or posterior oropharyngeal erythema.    Eyes: Pupils are equal, round, and reactive to light.  Cardiovascular: Normal rate.   Pulmonary/Chest: Effort normal.  Musculoskeletal: Normal range of motion.  Lymphadenopathy:    He has no cervical adenopathy.  Neurological: He is alert.  Skin: Skin is warm and dry. No erythema.    ED Course  Procedures (including critical care time) Labs Review Labs Reviewed - No data to display Imaging Review No results found.  EKG Interpretation   None       MDM   1. Pain, dental     Patient has been instructed to followup with his dentist as scheduled.  Next, week.  He's been given a prescription for penicillin and ibuprofen    Arman Filter, NP 09/10/13 [phone]

## 2013-09-12 ENCOUNTER — Encounter (HOSPITAL_COMMUNITY): Payer: Self-pay | Admitting: Emergency Medicine

## 2013-09-12 ENCOUNTER — Emergency Department (HOSPITAL_COMMUNITY)
Admission: EM | Admit: 2013-09-12 | Discharge: 2013-09-12 | Disposition: A | Discharge status: Home / Self Care | Payer: Self-pay | Attending: Emergency Medicine | Admitting: Emergency Medicine

## 2013-09-12 DIAGNOSIS — Z792 Long term (current) use of antibiotics: Secondary | ICD-10-CM | POA: Insufficient documentation

## 2013-09-12 DIAGNOSIS — F17200 Nicotine dependence, unspecified, uncomplicated: Secondary | ICD-10-CM | POA: Insufficient documentation

## 2013-09-12 DIAGNOSIS — K0889 Other specified disorders of teeth and supporting structures: Secondary | ICD-10-CM

## 2013-09-12 DIAGNOSIS — K006 Disturbances in tooth eruption: Secondary | ICD-10-CM | POA: Insufficient documentation

## 2013-09-12 DIAGNOSIS — K089 Disorder of teeth and supporting structures, unspecified: Secondary | ICD-10-CM | POA: Insufficient documentation

## 2013-09-12 DIAGNOSIS — K029 Dental caries, unspecified: Secondary | ICD-10-CM | POA: Insufficient documentation

## 2013-09-12 MED ORDER — TRAMADOL HCL 50 MG PO TABS: 50.0000 mg | ORAL_TABLET | Freq: Four times a day (QID) | ORAL | Status: DC | PRN

## 2013-09-12 MED ORDER — TRAMADOL HCL 50 MG PO TABS
50.0000 mg | ORAL_TABLET | Freq: Once | ORAL | Status: AC
  Administered 2013-09-12: 50 mg via ORAL
  Filled 2013-09-12: qty 1

## 2013-09-12 NOTE — ED Notes (Addendum)
Pt to the ED with dental pain. Pt states he was seen here two days ago. Pt was instructed to come back to ED if pain continues. Pt states his dental appointment is next week. Pt states he is unable to sleep because of the pain

## 2013-09-12 NOTE — ED Provider Notes (Signed)
Medical screening examination/treatment/procedure(s) were performed by non-physician practitioner and as supervising physician I was immediately available for consultation/collaboration.  EKG Interpretation   None         Richardean Canal, MD 09/12/13 [phone]

## 2013-09-12 NOTE — ED Provider Notes (Signed)
CSN: [id number]     Arrival date & time 09/12/13  8295 History   First MD Initiated Contact with Patient 09/12/13 0415     Chief Complaint  Patient presents with  . Dental Pain   (Consider location/radiation/quality/duration/timing/severity/associated sxs/prior Treatment) HPI Comments: Patient is a 27 yo M presenting to the ED for 1 week of left sided dental pain. Patient denies any precipitating injury or event. He states he was seen on 09/10/13 for the same complaint provided prescription for pain medication and an antibiotic which has not relieved his pain. He denies any alleviating or aggravating factors. Endorses scheduled follow up appointment with dentist. Denies fevers, trismus, sore throat, cough, purulent drainage.    Patient is a 27 y.o. male presenting with tooth pain.  Dental Pain Associated symptoms: no congestion and no fever     History reviewed. No pertinent past medical history. History reviewed. No pertinent past surgical history. History reviewed. No pertinent family history. History  Substance Use Topics  . Smoking status: Current Every Day Smoker  . Smokeless tobacco: Not on file  . Alcohol Use: No    Review of Systems  Constitutional: Negative for fever.  HENT: Positive for dental problem. Negative for congestion, trouble swallowing and voice change.   Respiratory: Negative for cough, shortness of breath and wheezing.   All other systems reviewed and are negative.    Allergies  Review of patient's allergies indicates no known allergies.  Home Medications   Current Outpatient Rx  Name  Route  Sig  Dispense  Refill  . ibuprofen (ADVIL,MOTRIN) 600 MG tablet   Oral   Take 1 tablet (600 mg total) by mouth every 6 (six) hours as needed for pain.   30 tablet   0   . penicillin v potassium (VEETID) 500 MG tablet   Oral   Take 1 tablet (500 mg total) by mouth 3 (three) times daily.   30 tablet   0   . traMADol (ULTRAM) 50 MG tablet   Oral   Take 1  tablet (50 mg total) by mouth every 6 (six) hours as needed for pain.   12 tablet   0    BP 126/71  Pulse 77  Temp(Src) 97.9 F (36.6 C) (Oral)  Resp 18  SpO2 100% Physical Exam  Constitutional: He is oriented to person, place, and time. He appears well-developed and well-nourished. No distress.  HENT:  Head: Normocephalic and atraumatic.  Right Ear: External ear normal.  Left Ear: External ear normal.  Nose: Nose normal.  Mouth/Throat: Uvula is midline, oropharynx is clear and moist and mucous membranes are normal. No oral lesions. No trismus in the jaw. Abnormal dentition. Dental caries present. No dental abscesses or uvula swelling.    Eyes: Conjunctivae are normal.  Neck: Normal range of motion. Neck supple.  Pulmonary/Chest: Effort normal.  Neurological: He is alert and oriented to person, place, and time.  Skin: Skin is warm and dry. He is not diaphoretic.  Psychiatric: He has a normal mood and affect.    ED Course  Procedures (including critical care time) Medications  traMADol (ULTRAM) tablet 50 mg (50 mg Oral Given 09/12/13 0444)    Labs Review Labs Reviewed - No data to display Imaging Review No results found.  EKG Interpretation   None       MDM   1. Pain, dental     Afebrile, NAD, non-toxic appearing, AAOx4. Patient with toothache.  No gross abscess.  Exam unconcerning for Ludwig's  angina or spread of infection.  Advised continued use of penicillin. Will provide prescription for ultram. Advised to keep dental follow up appointment. Patient is agreeable to plan. Patient is stable at time of discharge.       Jeannetta Ellis, PA-C 09/12/13 1191

## 2015-02-09 ENCOUNTER — Telehealth: Payer: Self-pay | Admitting: Internal Medicine

## 2015-02-09 NOTE — Telephone Encounter (Signed)
[facility] for appointment on 03/11/15 with Dr Rennis GoldenHilty.  [facility] for Dr Blanchie DessertHilty's schedule on 03/11/15. lp

## 2015-02-21 ENCOUNTER — Telehealth: Payer: Self-pay | Admitting: Internal Medicine

## 2015-02-21 ENCOUNTER — Encounter: Payer: Self-pay | Admitting: Internal Medicine

## 2015-02-22 NOTE — Telephone Encounter (Signed)
Close encounter 

## 2015-03-11 ENCOUNTER — Ambulatory Visit: Payer: Self-pay | Admitting: Internal Medicine

## 2015-03-31 ENCOUNTER — Ambulatory Visit: Payer: Self-pay | Admitting: Internal Medicine

## 2018-06-05 ENCOUNTER — Encounter (HOSPITAL_COMMUNITY): Payer: Self-pay | Admitting: *Deleted

## 2018-06-05 ENCOUNTER — Emergency Department (HOSPITAL_COMMUNITY)
Admission: EM | Admit: 2018-06-05 | Discharge: 2018-06-06 | Discharge status: Against Medical Advice | Payer: 59 | Attending: Emergency Medicine | Admitting: Emergency Medicine

## 2018-06-05 ENCOUNTER — Other Ambulatory Visit: Payer: Self-pay

## 2018-06-05 DIAGNOSIS — M549 Dorsalgia, unspecified: Secondary | ICD-10-CM | POA: Diagnosis not present

## 2018-06-05 DIAGNOSIS — Z5321 Procedure and treatment not carried out due to patient leaving prior to being seen by health care provider: Secondary | ICD-10-CM | POA: Insufficient documentation

## 2018-06-05 NOTE — ED Triage Notes (Signed)
Pt c/o mid back pain onset today that started after heavy lifting today

## 2018-06-06 NOTE — ED Notes (Signed)
Pt. Called back to room with no response x3.

## 2018-11-12 ENCOUNTER — Other Ambulatory Visit: Payer: Self-pay

## 2018-11-12 ENCOUNTER — Emergency Department (HOSPITAL_COMMUNITY)
Admission: EM | Admit: 2018-11-12 | Discharge: 2018-11-13 | Disposition: A | Discharge status: Home / Self Care | Payer: 59 | Attending: Emergency Medicine | Admitting: Emergency Medicine

## 2018-11-12 ENCOUNTER — Encounter (HOSPITAL_COMMUNITY): Payer: Self-pay

## 2018-11-12 DIAGNOSIS — Z87891 Personal history of nicotine dependence: Secondary | ICD-10-CM | POA: Insufficient documentation

## 2018-11-12 DIAGNOSIS — F329 Major depressive disorder, single episode, unspecified: Secondary | ICD-10-CM | POA: Diagnosis not present

## 2018-11-12 DIAGNOSIS — R45851 Suicidal ideations: Secondary | ICD-10-CM

## 2018-11-12 DIAGNOSIS — Z046 Encounter for general psychiatric examination, requested by authority: Secondary | ICD-10-CM | POA: Diagnosis present

## 2018-11-12 DIAGNOSIS — F1010 Alcohol abuse, uncomplicated: Secondary | ICD-10-CM | POA: Diagnosis not present

## 2018-11-12 LAB — COMPREHENSIVE METABOLIC PANEL
ALBUMIN: 4.6 g/dL (ref 3.5–5.0)
ALT: 29 U/L (ref 0–44)
ANION GAP: 12 (ref 5–15)
AST: 23 U/L (ref 15–41)
Alkaline Phosphatase: 40 U/L (ref 38–126)
BILIRUBIN TOTAL: 0.5 mg/dL (ref 0.3–1.2)
BUN: 16 mg/dL (ref 6–20)
CHLORIDE: 107 mmol/L (ref 98–111)
CO2: 23 mmol/L (ref 22–32)
Calcium: 9 mg/dL (ref 8.9–10.3)
Creatinine, Ser: 0.89 mg/dL (ref 0.61–1.24)
GFR calc Af Amer: >60 mL/min (ref >60)
GFR calc non Af Amer: >60 mL/min (ref >60)
GLUCOSE: 91 mg/dL (ref 70–99)
POTASSIUM: 3.8 mmol/L (ref 3.5–5.1)
SODIUM: 142 mmol/L (ref 135–145)
Total Protein: 8.2 g/dL — ABNORMAL HIGH (ref 6.5–8.1)

## 2018-11-12 LAB — CBC WITH DIFFERENTIAL/PLATELET
ABS IMMATURE GRANULOCYTES: 0.09 10*3/uL — AB (ref 0.00–0.07)
BASOS ABS: 0 10*3/uL (ref 0.0–0.1)
Basophils Relative: 0 %
Eosinophils Absolute: 0 10*3/uL (ref 0.0–0.5)
Eosinophils Relative: 0 %
HEMATOCRIT: 50.3 % (ref 39.0–52.0)
Hemoglobin: 16.9 g/dL (ref 13.0–17.0)
IMMATURE GRANULOCYTES: 1 %
LYMPHS PCT: 15 %
Lymphs Abs: 2 10*3/uL (ref 0.7–4.0)
MCH: 29.7 pg (ref 26.0–34.0)
MCHC: 33.6 g/dL (ref 30.0–36.0)
MCV: 88.4 fL (ref 80.0–100.0)
Monocytes Absolute: 0.5 10*3/uL (ref 0.1–1.0)
Monocytes Relative: 4 %
NEUTROS ABS: 10.2 10*3/uL — AB (ref 1.7–7.7)
NEUTROS PCT: 80 %
NRBC: 0 % (ref 0.0–0.2)
Platelets: 326 10*3/uL (ref 150–400)
RBC: 5.69 MIL/uL (ref 4.22–5.81)
RDW: 12.7 % (ref 11.5–15.5)
WBC: 12.9 10*3/uL — ABNORMAL HIGH (ref 4.0–10.5)

## 2018-11-12 LAB — SALICYLATE LEVEL

## 2018-11-12 LAB — ETHANOL: Alcohol, Ethyl (B): 128 mg/dL — ABNORMAL HIGH (ref <10)

## 2018-11-12 LAB — ACETAMINOPHEN LEVEL: Acetaminophen (Tylenol), Serum: <10 ug/mL — ABNORMAL LOW (ref 10–30)

## 2018-11-12 NOTE — ED Triage Notes (Signed)
[facility], Pt parents called for suicidal ideation, upon Sheriff arrival Pt had knife to throat, family attempted to stop Pt, Sheriff threatened with taser to cease, Pt complied and has been cooperative at arrival. Prior arrest today from driving impaired, after release incident occurred. Pt states caught wife of 15 years cheating on him. Pt states he wants to get help for SI and depression. Family in process of IVC paper

## 2018-11-12 NOTE — ED Notes (Signed)
Pt mother and father visited patient briefly. Pt is calm and cooperative at this time.

## 2018-11-12 NOTE — Progress Notes (Signed)
Pt accepted to [facility]; 305-1  Donell Sievert, PA is the accepting provider.   Dr. Jola Babinski is the attending provider.   Call report to [phone]   Pt is involuntary and will be transported by law enforcement.   Pt is scheduled to arrive at [facility] at 1pm on 11/13/18  Artist Pais, LCAS Disposition CSW [facility]/TTS [phone]

## 2018-11-12 NOTE — Progress Notes (Signed)
TTS consulted with [name], PA who recommends inpt treatment. TCU nurse has been advised.   [facility] to Dr. Jama Flavorsobos, MD call to report [phone]. [facility]. ED staff Corridon, Celestial T, RN and EDP Sophia, PA [facility]. AC confirms bed will be available at 01:00 on 11/13/2018.  Princess BruinsAquicha Ilene Thomas, MSW, LCSW Therapeutic Triage Specialist  [phone]

## 2018-11-12 NOTE — ED Provider Notes (Signed)
[facility]-EMERGENCY DEPT Provider Note   CSN: [id number] Arrival date & time: 11/12/18  1726     History   Chief Complaint Chief Complaint  Patient presents with  . Suicidal    HPI Maurice Thomas is a 33 y.o. male presenting under IVC for suicidal thoughts.  Patient states he drank alcohol today, caught his wife cheating on him, and then became suicidal.  This is after he had a DUI.  He held a knife to his throat threatening to kill himself.  Patient refused but then the knife until police threatened to tase him.  Patient denies a history of suicidal thoughts or attempts.  He has no other medical problems, takes medications daily.  He denies recent fevers, chills, sore throat, cough, chest pain, shortness of breath, nausea, vomiting, domino pain, urinary symptoms, normal bowel movements.  Additional history obtained from chart review.  Patient has been seen at other healthcare providers while intoxicated threatening suicidal thoughts.  SI normally resolves after he sobers up.  HPI  History reviewed. No pertinent past medical history.  There are no active problems to display for this patient.   History reviewed. No pertinent surgical history.      Home Medications    Prior to Admission medications   Medication Sig Start Date End Date Taking? Authorizing Provider  ibuprofen (ADVIL,MOTRIN) 600 MG tablet Take 1 tablet (600 mg total) by mouth every 6 (six) hours as needed for pain. Patient not taking: Reported on 11/12/2018 09/10/13   Earley Favor, NP  penicillin v potassium (VEETID) 500 MG tablet Take 1 tablet (500 mg total) by mouth 3 (three) times daily. Patient not taking: Reported on 11/12/2018 09/10/13   Earley Favor, NP  traMADol (ULTRAM) 50 MG tablet Take 1 tablet (50 mg total) by mouth every 6 (six) hours as needed for pain. Patient not taking: Reported on 11/12/2018 09/12/13   Francee Piccolo, PA-C    Family History History reviewed. No pertinent  family history.  Social History Social History   Tobacco Use  . Smoking status: Former Games developer  . Smokeless tobacco: Never Used  Substance Use Topics  . Alcohol use: No  . Drug use: No     Allergies   Patient has no known allergies.   Review of Systems Review of Systems  Psychiatric/Behavioral: Positive for suicidal ideas.  All other systems reviewed and are negative.    Physical Exam Updated Vital Signs BP 115/71 (BP Location: Left Arm)   Pulse 89   Temp 97.7 F (36.5 C) (Oral)   Resp 17   SpO2 99%   Physical Exam Vitals signs and nursing note reviewed.  Constitutional:      General: He is not in acute distress.    Appearance: He is well-developed.     Comments: Calm and cooperative.  In no acute distress.  HENT:     Head: Normocephalic and atraumatic.  Eyes:     Conjunctiva/sclera: Conjunctivae normal.     Pupils: Pupils are equal, round, and reactive to light.  Neck:     Musculoskeletal: Normal range of motion and neck supple.  Pulmonary:     Effort: No respiratory distress.  Abdominal:     General: There is no distension.  Musculoskeletal: Normal range of motion.  Skin:    General: Skin is warm and dry.  Neurological:     Mental Status: He is alert and oriented to person, place, and time.  Psychiatric:     Comments: Denying SI currently.  ED Treatments / Results  Labs (all labs ordered are listed, but only abnormal results are displayed) Labs Reviewed  COMPREHENSIVE METABOLIC PANEL - Abnormal; Notable for the following components:      Result Value   Total Protein 8.2 (*)    All other components within normal limits  ETHANOL - Abnormal; Notable for the following components:   Alcohol, Ethyl (B) 128 (*)    All other components within normal limits  CBC WITH DIFFERENTIAL/PLATELET - Abnormal; Notable for the following components:   WBC 12.9 (*)    Neutro Abs 10.2 (*)    Abs Immature Granulocytes 0.09 (*)    All other components within  normal limits  ACETAMINOPHEN LEVEL - Abnormal; Notable for the following components:   Acetaminophen (Tylenol), Serum <10 (*)    All other components within normal limits  SALICYLATE LEVEL  RAPID URINE DRUG SCREEN, HOSP PERFORMED    EKG None  Radiology No results found.  Procedures Procedures (including critical care time)  Medications Ordered in ED Medications - No data to display   Initial Impression / Assessment and Plan / ED Course  I have reviewed the triage vital signs and the nursing notes.  Pertinent labs & imaging results that were available during my care of the patient were reviewed by me and considered in my medical decision making (see chart for details).     Patient presenting under IVC after threatening to kill himself by putting a knife to his neck.  Additionally, patient has been drinking alcohol.  In the ED, he is calm and cooperative.  He has no other medical problems.  Will obtain medical clearance labs and consult with psych.  Labs with mild leukocytosis. Pt without fever or other infectious sxs, as such, I do not believe he needs further work up. Consider elevation due to etoh. etoh mildly elevated at 128.   [facility] team recommending inpatient tx.   Final Clinical Impressions(s) / ED Diagnoses   Final diagnoses:  None    ED Discharge Orders    None       Alveria Apley, PA-C 11/12/18 2127    Little, Ambrose Finland, MD 11/13/18 [phone]

## 2018-11-12 NOTE — ED Notes (Signed)
Pt made aware urine specimen is needed at this time.

## 2018-11-12 NOTE — BH Assessment (Addendum)
Assessment Note  Maurice IvanoffCarlos Thomas is an 33 y.o. male who presents to the ED under IVC initiated by his family. Per IVC, "respondent was speaking incoherently and stated he wanted to commit suicide. Respondent had a blade at his throat as if to cut his throat. Respondent stated to father that if he call the police that he would cut his throat. Deputies responded to scene and talked him down from cutting his throat. Respondent has just been charged with a DUI today."  Pt reports he found out today that his wife of 15 years is having an affair with his best friend. Pt admits he made threats to kill himself and held a knife to his throat. Pt states he wanted to kill himself to make his wife feel guilty for cheating on him.   During the assessment, pt stated he is no longer suicidal and wants to be d/c to go to work at [employer]. Pt appears to be minimizing his suicide attempt. Pt admits he drank in excess today and consumed half a bottle of liquor. Pt denies any other complaints at present. [facility] due to alcohol dependence and making suicidal threats.   TTS consulted with [name], PA who recommends inpt treatment. TCU nurse has been advised.   Diagnosis: MDD, single episode, severe, w/o psychosis; Alcohol use disorder, severe  Past Medical History: History reviewed. No pertinent past medical history.  History reviewed. No pertinent surgical history.  Family History: History reviewed. No pertinent family history.  Social History:  reports that he has quit smoking. He has never used smokeless tobacco. He reports that he does not drink alcohol or use drugs.  Additional Social History:  Alcohol / Drug Use Pain Medications: See MAR Prescriptions: See MAR Over the Counter: See MAR History of alcohol / drug use?: Yes Substance #1 Name of Substance 1: Alcohol 1 - Age of First Use: 25 1 - Amount (size/oz): BAL 128, pt drank 1/2 bottle of liquor today 1 - Frequency: binge  drinking today, usually rare 1 - Duration: ongoing 1 - Last Use / Amount: 11/12/18  CIWA: CIWA-Ar BP: 115/71 Pulse Rate: 89 COWS:    Allergies: No Known Allergies  Home Medications: (Not in a hospital admission)   OB/GYN Status:  No LMP for male patient.  General Assessment Data Location of Assessment: [facility] TTS Assessment: In system Is this a Tele or Face-to-Face Assessment?: Face-to-Face Is this an Initial Assessment or a Re-assessment for this encounter?: Initial Assessment Patient Accompanied by:: N/A Language Other than English: Yes What is your preferred language: Spanish Living Arrangements: Other (Comment) What gender do you identify as?: Male Marital status: Married Pregnancy Status: No Living Arrangements: Spouse/significant other Can pt return to current living arrangement?: Yes Admission Status: Involuntary Petitioner: Family member Is patient capable of signing voluntary admission?: No Referral Source: Self/Family/Friend [facility]     Crisis Care Plan Living Arrangements: Spouse/significant other Name of Psychiatrist: none Name of Therapist: none  Education Status Is patient currently in school?: No Is the patient employed, unemployed or receiving disability?: Employed  Risk to self with the past 6 months Suicidal Ideation: Yes-Currently Present Has patient been a risk to self within the past 6 months prior to admission? : Yes Suicidal Intent: Yes-Currently Present Has patient had any suicidal intent within the past 6 months prior to admission? : Yes Is patient at risk for suicide?: Yes Suicidal Plan?: Yes-Currently Present Has patient had any suicidal  plan within the past 6 months prior to admission? : Yes Specify Current Suicidal Plan: pt reportedly held a knife to his throat today and threatened to kill himself  Access to Means: Yes Specify Access to Suicidal Means: pt has accesss to a knife  What has been your use of drugs/alcohol  within the last 12 months?: binge drinking alcohol Previous Attempts/Gestures: No Triggers for Past Attempts: None known Intentional Self Injurious Behavior: None Family Suicide History: No Recent stressful life event(s): Conflict (Comment), Turmoil (Comment)(wife having an affair) Persecutory voices/beliefs?: No Depression: Yes Depression Symptoms: Feeling angry/irritable, Feeling worthless/self pity Substance abuse history and/or treatment for substance abuse?: Yes Suicide prevention information given to non-admitted patients: Not applicable  Risk to Others within the past 6 months Homicidal Ideation: No Does patient have any lifetime risk of violence toward others beyond the six months prior to admission? : No Thoughts of Harm to Others: No Current Homicidal Intent: No Current Homicidal Plan: No Access to Homicidal Means: No History of harm to others?: No Assessment of Violence: None Noted Does patient have access to weapons?: No Criminal Charges Pending?: Yes Describe Pending Criminal Charges: pt denies, per IVC pt has pending DUI charge  Does patient have a court date: Yes Court Date: (unknown) Is patient on probation?: No  Psychosis Hallucinations: None noted Delusions: None noted  Mental Status Report Appearance/Hygiene: Unremarkable, In scrubs Eye Contact: Good Motor Activity: Freedom of movement Speech: Logical/coherent Level of Consciousness: Alert Mood: Euthymic Affect: Appropriate to circumstance Anxiety Level: None Thought Processes: Relevant, Coherent Judgement: Impaired Orientation: Person, Place, Time, Appropriate for developmental age Obsessive Compulsive Thoughts/Behaviors: None  Cognitive Functioning Concentration: Normal Memory: Remote Intact, Recent Intact Is patient IDD: No Insight: Poor Impulse Control: Poor Appetite: Good Have you had any weight changes? : No Change Sleep: No Change Total Hours of Sleep: 8 Vegetative Symptoms:  None  ADLScreening [facility] Assessment Services) Patient's cognitive ability adequate to safely complete daily activities?: Yes Patient able to express need for assistance with ADLs?: Yes Independently performs ADLs?: Yes (appropriate for developmental age)  Prior Inpatient Therapy Prior Inpatient Therapy: Yes Prior Therapy Dates: 2007, 2018 Prior Therapy Facilty/Provider(s): [facility] Reason for Treatment: ALCOHOL  Prior Outpatient Therapy Prior Outpatient Therapy: No Does patient have an ACCT team?: No Does patient have Intensive In-House Services?  : No Does patient have [facility] services? : No Does patient have P4CC services?: No  ADL Screening (condition at time of admission) Patient's cognitive ability adequate to safely complete daily activities?: Yes Is the patient deaf or have difficulty hearing?: No Does the patient have difficulty seeing, even when wearing glasses/contacts?: No Does the patient have difficulty concentrating, remembering, or making decisions?: No Patient able to express need for assistance with ADLs?: Yes Does the patient have difficulty dressing or bathing?: No Independently performs ADLs?: Yes (appropriate for developmental age) Does the patient have difficulty walking or climbing stairs?: No Weakness of Legs: None Weakness of Arms/Hands: None  Home Assistive Devices/Equipment Home Assistive Devices/Equipment: None    Abuse/Neglect Assessment (Assessment to be complete while patient is alone) Abuse/Neglect Assessment Can Be Completed: Yes Physical Abuse: Denies Verbal Abuse: Denies Sexual Abuse: Denies Exploitation of patient/patient's resources: Denies Self-Neglect: Denies     Merchant navy officer (For Healthcare) Does Patient Have a Medical Advance Directive?: No Would patient like information on creating a medical advance directive?: No - Patient declined          Disposition:  Disposition Initial Assessment Completed for this Encounter:  Yes Disposition  of Patient: Admit Type of inpatient treatment program: Adult Patient refused recommended treatment: No  On Site Evaluation by:   Reviewed with Physician:    Karolee Ohs 11/12/2018 8:49 PM

## 2018-11-13 ENCOUNTER — Encounter (HOSPITAL_COMMUNITY): Payer: Self-pay

## 2018-11-13 ENCOUNTER — Other Ambulatory Visit: Payer: Self-pay

## 2018-11-13 ENCOUNTER — Inpatient Hospital Stay (HOSPITAL_COMMUNITY)
Admission: AD | Admit: 2018-11-13 | Discharge: 2018-11-15 | DRG: 885 | Disposition: A | Discharge status: Home / Self Care | Payer: 59 | Source: Intra-hospital | Attending: Psychiatry | Admitting: Psychiatry

## 2018-11-13 DIAGNOSIS — F17210 Nicotine dependence, cigarettes, uncomplicated: Secondary | ICD-10-CM | POA: Diagnosis present

## 2018-11-13 DIAGNOSIS — G4700 Insomnia, unspecified: Secondary | ICD-10-CM | POA: Diagnosis present

## 2018-11-13 DIAGNOSIS — R45851 Suicidal ideations: Secondary | ICD-10-CM | POA: Diagnosis present

## 2018-11-13 DIAGNOSIS — F1020 Alcohol dependence, uncomplicated: Secondary | ICD-10-CM

## 2018-11-13 DIAGNOSIS — F332 Major depressive disorder, recurrent severe without psychotic features: Secondary | ICD-10-CM | POA: Diagnosis present

## 2018-11-13 MED ORDER — LORAZEPAM 1 MG PO TABS: 1.0000 mg | ORAL_TABLET | Freq: Four times a day (QID) | ORAL | Status: DC | PRN

## 2018-11-13 MED ORDER — ACETAMINOPHEN 325 MG PO TABS: 650.0000 mg | ORAL_TABLET | Freq: Four times a day (QID) | ORAL | Status: DC | PRN

## 2018-11-13 MED ORDER — ADULT MULTIVITAMIN W/MINERALS CH
1.0000 | ORAL_TABLET | Freq: Every day | ORAL | Status: DC
  Administered 2018-11-13 – 2018-11-15 (×3): 1 via ORAL
  Filled 2018-11-13 (×6): qty 1

## 2018-11-13 MED ORDER — LOPERAMIDE HCL 2 MG PO CAPS: 2.0000 mg | ORAL_CAPSULE | ORAL | Status: DC | PRN

## 2018-11-13 MED ORDER — MAGNESIUM HYDROXIDE 400 MG/5ML PO SUSP: 30.0000 mL | Freq: Every day | ORAL | Status: DC | PRN

## 2018-11-13 MED ORDER — LORAZEPAM 1 MG PO TABS: 1.0000 mg | ORAL_TABLET | Freq: Two times a day (BID) | ORAL | Status: DC

## 2018-11-13 MED ORDER — FLUOXETINE HCL 20 MG PO CAPS
20.0000 mg | ORAL_CAPSULE | Freq: Every day | ORAL | Status: DC
  Administered 2018-11-13 – 2018-11-15 (×3): 20 mg via ORAL
  Filled 2018-11-13 (×6): qty 1

## 2018-11-13 MED ORDER — ALUM & MAG HYDROXIDE-SIMETH 200-200-20 MG/5ML PO SUSP: 30.0000 mL | ORAL | Status: DC | PRN

## 2018-11-13 MED ORDER — LORAZEPAM 1 MG PO TABS
1.0000 mg | ORAL_TABLET | Freq: Four times a day (QID) | ORAL | Status: AC
  Administered 2018-11-13 – 2018-11-14 (×6): 1 mg via ORAL
  Filled 2018-11-13 (×6): qty 1

## 2018-11-13 MED ORDER — VITAMIN B-1 100 MG PO TABS
100.0000 mg | ORAL_TABLET | Freq: Every day | ORAL | Status: DC
  Administered 2018-11-14 – 2018-11-15 (×2): 100 mg via ORAL
  Filled 2018-11-13 (×4): qty 1

## 2018-11-13 MED ORDER — LORAZEPAM 1 MG PO TABS: 1.0000 mg | ORAL_TABLET | Freq: Every day | ORAL | Status: DC

## 2018-11-13 MED ORDER — HYDROXYZINE HCL 25 MG PO TABS
25.0000 mg | ORAL_TABLET | Freq: Four times a day (QID) | ORAL | Status: DC | PRN
  Administered 2018-11-14: 25 mg via ORAL
  Filled 2018-11-13: qty 1

## 2018-11-13 MED ORDER — LORAZEPAM 1 MG PO TABS
1.0000 mg | ORAL_TABLET | Freq: Three times a day (TID) | ORAL | Status: AC
  Administered 2018-11-14 – 2018-11-15 (×3): 1 mg via ORAL
  Filled 2018-11-13 (×3): qty 1

## 2018-11-13 MED ORDER — TRAZODONE HCL 50 MG PO TABS
50.0000 mg | ORAL_TABLET | Freq: Every evening | ORAL | Status: DC | PRN
  Administered 2018-11-13: 50 mg via ORAL
  Filled 2018-11-13: qty 1

## 2018-11-13 MED ORDER — TRAZODONE HCL 50 MG PO TABS
50.0000 mg | ORAL_TABLET | Freq: Every evening | ORAL | Status: DC | PRN
  Administered 2018-11-13 – 2018-11-14 (×4): 50 mg via ORAL
  Filled 2018-11-13 (×2): qty 1

## 2018-11-13 MED ORDER — THIAMINE HCL 100 MG/ML IJ SOLN: 100.0000 mg | Freq: Once | INTRAMUSCULAR | Status: DC

## 2018-11-13 MED ORDER — ONDANSETRON 4 MG PO TBDP: 4.0000 mg | ORAL_TABLET | Freq: Four times a day (QID) | ORAL | Status: DC | PRN

## 2018-11-13 NOTE — Tx Team (Signed)
Initial Treatment Plan 11/13/2018 2:53 AM [name] MEQ:[id number]    PATIENT STRESSORS: Marital or family conflict Substance abuse   PATIENT STRENGTHS: General fund of knowledge Work skills   PATIENT IDENTIFIED PROBLEMS: Risk for suicide  ETOH  "nothing"                 DISCHARGE CRITERIA:  Improved stabilization in mood, thinking, and/or behavior Verbal commitment to aftercare and medication compliance  PRELIMINARY DISCHARGE PLAN: Attend aftercare/continuing care group Attend 12-step recovery group Outpatient therapy  PATIENT/FAMILY INVOLVEMENT: This treatment plan has been presented to and reviewed with the patient, Maurice Thomas.  The patient and family have been given the opportunity to ask questions and make suggestions.  Delos Haring, RN 11/13/2018, 2:53 AM

## 2018-11-13 NOTE — Progress Notes (Signed)
[facility] Group Notes:  (Nursing/MHT/Case Management/Adjunct)  Date:  11/13/2018  Time: 2030  Type of Therapy:  wrap up group  Participation Level:  Minimal  Participation Quality:  Attentive and Supportive  Affect:  Depressed  Cognitive:  Appropriate  Insight:  Lacking  Engagement in Group:  Supportive  Modes of Intervention:  Clarification, Education and Support  Summary of Progress/Problems: Pt shared that he was happy about having coffee to drink today. If he could change any one thing it would be how his family treats him, he reports currently having no contact with them. Pt shared that he is grateful for his 10 year old son.   Marcille Buffy 11/13/2018, 10:13 PM

## 2018-11-13 NOTE — Progress Notes (Signed)
Patient ID: Caseton Halaby, male   DOB: June 21, 1986, 32 y.o.   MRN: [id number]  Admission Note:  33 yr male who presents IVC in no acute distress for the treatment of SI  and ETOH. Pt appears sad , but silly on admission. Pt was calm and cooperative with admission process. Pt denied SI/ HI/ AVH/ pain at this time. Pt ETOH- 138 on admission. Pt stated " I want to go back to work [employer], I worked there 14 years " pt appears to be minimizing issues that brought him to the hospital. Pt stated he drinks a 12 pk monthly.   A:Skin was assessed and found to be clear of any abnormal marks apart from a ols surgical  scar on R-forearm. PT searched and no contraband found, POC and unit policies explained and understanding verbalized. Consents obtained. Food and fluids offered, and refused .   R:Pt had no additional questions or concerns.

## 2018-11-13 NOTE — H&P (Signed)
Psychiatric Admission Assessment Adult  Patient Identification: Maurice Thomas  MRN:  [id number]  Date of Evaluation:  11/13/2018  Chief Complaint:  Mdd single episode severe ETOH use disorder  Principal Diagnosis: Alcohol use disorder, moderate, dependence (HCC)  Diagnosis:  Principal Problem:   Alcohol use disorder, moderate, dependence (HCC) Active Problems:   MDD (major depressive disorder), recurrent episode, severe (HCC)  History of Present Illness: This is an admission assessment for Maurice Thomas, a 33 year old Hispanic male. [facility] an [facility] petition for threatening suicide by holding a knife to his throat. Chart review reports that patient reported at the ED that he found his wife cheating with another man. [facility] a [facility]. After his medical evaluation/clearance, [facility] for [facility] health evaluation & alcohol detoxification treatments. His BAL on admission was 128. He is started on the Ativan detoxification treatment protocols for alcohol detox.  During this assessment, Maurice Thomas reports, "My [facility]. I told my dad that I was going to kill myself with a knife. I held the knife on my throat. The reason for all these is because I found my wife cheating with another man. This is my first attempt & threat to hurt myself. I was not depressed prior to finding my wife with another man. After I caught my wife with another man, I drank a half-bottle of alcohol yesterday. I have not been drinking a lot prior to this. But, I'm feeling a lot better today than when I was brought to the ED. I used to take depression medicine in the past. I cannot remember the name of that medicine. I have not been on depression medicine in a long time. I'm willing to try depression medicine again. I don't know how you guys can help me because I'm going to split from my wife. I need to go back to work  please".  Associated Signs/Symptoms:  Depression Symptoms:  depressed mood, insomnia,  (Hypo) Manic Symptoms:  Impulsivity, Labiality of Mood,  Anxiety Symptoms:  Excessive Worry,  Psychotic Symptoms:  Denies any hallucinations, delusions or paranoia  PTSD Symptoms: Denies any PTSD symptoms or events.  Total Time spent with patient: 1 hour  Past Psychiatric History: Alcoholism, Major depression.  Is the patient at risk to self? No.  Has the patient been a risk to self in the past 6 months? Yes.    Has the patient been a risk to self within the distant past? Yes.    Is the patient a risk to others? No.  Has the patient been a risk to others in the past 6 months? No.  Has the patient been a risk to others within the distant past? No.   Prior Inpatient Therapy: Yes [facility] Chart review [facility]  [facility] [place], [place]  Alcohol Screening: 1. How often do you have a drink containing alcohol?: Monthly or less 2. How many drinks containing alcohol do you have on a typical day when you are drinking?: 10 or more 3. How often do you have six or more drinks on one occasion?: Monthly AUDIT-C Score: 7 4. How often during the last year have you found that you were not able to stop drinking once you had started?: Never 5. How often during the last year have you failed to do  what was normally expected from you becasue of drinking?: Never 6. How often during the last year have you needed a first drink in the morning to get yourself going after a heavy drinking session?: Never 7. How often during the last year have you had a feeling of guilt of remorse after drinking?: Never 8. How often during the last year have you been unable to remember what happened the night before because you had been drinking?: Never 9. Have you or someone else been injured as a result of your drinking?: No 10.  Has a relative or friend or a doctor or another health worker been concerned about your drinking or suggested you cut down?: No Alcohol Use Disorder Identification Test Final Score (AUDIT): 7 Intervention/Follow-up: AUDIT Score <7 follow-up not indicated  Substance Abuse History in the last 12 months:  Yes.    Consequences of Substance Abuse: Medical Consequences:  Liver damage, Possible death by overdose Legal Consequences:  Arrests, jail time, Loss of driving privilege. Family Consequences:  Family discord, divorce and or separation.  Previous Psychotropic Medications: Yes (Unable to remember the name).  Psychological Evaluations: No   Past Medical History: History reviewed. No pertinent past medical history. History reviewed. No pertinent surgical history.  Family History: History reviewed. No pertinent family history.  Family Psychiatric  History: Denies any familial hx of mental illness or substance use disorder.  Tobacco Screening: Have you used any form of tobacco in the last 30 days? (Cigarettes, Smokeless Tobacco, Cigars, and/or Pipes): Yes Tobacco use, Select all that apply: 5 or more cigarettes per day Are you interested in Tobacco Cessation Medications?: No, patient refused Counseled patient on smoking cessation including recognizing danger situations, developing coping skills and basic information about quitting provided: Refused/Declined practical counseling  Social History: Patient is married, has 3 children. Lives in [place], [place]. Employed, smokes a pack of cigarettes daily.  Social History   Substance and Sexual Activity  Alcohol Use Yes     Social History   Substance and Sexual Activity  Drug Use No    Additional Social History:  Allergies:  No Known Allergies  Lab Results:  Results for orders placed or performed during the hospital encounter of 11/12/18 (from the past 48 hour(s))  Comprehensive metabolic panel     Status: Abnormal   Collection Time:  11/12/18  6:07 PM  Result Value Ref Range   Sodium 142 135 - 145 mmol/L   Potassium 3.8 3.5 - 5.1 mmol/L   Chloride 107 98 - 111 mmol/L   CO2 23 22 - 32 mmol/L   Glucose, Bld 91 70 - 99 mg/dL   BUN 16 6 - 20 mg/dL   Creatinine, Ser 4.09 0.61 - 1.24 mg/dL   Calcium 9.0 8.9 - 81.1 mg/dL   Total Protein 8.2 (H) 6.5 - 8.1 g/dL   Albumin 4.6 3.5 - 5.0 g/dL   AST 23 15 - 41 U/L   ALT 29 0 - 44 U/L   Alkaline Phosphatase 40 38 - 126 U/L   Total Bilirubin 0.5 0.3 - 1.2 mg/dL   GFR calc non Af Amer >60 >60 mL/min   GFR calc Af Amer >60 >60 mL/min   Anion gap 12 5 - 15    Comment: Performed at [facility], [address] [place], [place] [address]  CBC with Diff     Status: Abnormal   Collection Time: 11/12/18  6:07 PM  Result Value Ref Range   WBC 12.9 (H) 4.0 -  10.5 K/uL   RBC 5.69 4.22 - 5.81 MIL/uL   Hemoglobin 16.9 13.0 - 17.0 g/dL   HCT 16.1 09.6 - 04.5 %   MCV 88.4 80.0 - 100.0 fL   MCH 29.7 26.0 - 34.0 pg   MCHC 33.6 30.0 - 36.0 g/dL   RDW 40.9 81.1 - 91.4 %   Platelets 326 150 - 400 K/uL   nRBC 0.0 0.0 - 0.2 %   Neutrophils Relative % 80 %   Neutro Abs 10.2 (H) 1.7 - 7.7 K/uL   Lymphocytes Relative 15 %   Lymphs Abs 2.0 0.7 - 4.0 K/uL   Monocytes Relative 4 %   Monocytes Absolute 0.5 0.1 - 1.0 K/uL   Eosinophils Relative 0 %   Eosinophils Absolute 0.0 0.0 - 0.5 K/uL   Basophils Relative 0 %   Basophils Absolute 0.0 0.0 - 0.1 K/uL   Immature Granulocytes 1 %   Abs Immature Granulocytes 0.09 (H) 0.00 - 0.07 K/uL    Comment: Performed at [facility], [address] [place], [place] [address]  Ethanol     Status: Abnormal   Collection Time: 11/12/18  6:08 PM  Result Value Ref Range   Alcohol, Ethyl (B) 128 (H) <10 mg/dL    Comment: (NOTE) Lowest detectable limit for serum alcohol is 10 mg/dL. For medical purposes only. Performed at [facility], [address], [place], [place] [address]   Acetaminophen  level     Status: Abnormal   Collection Time: 11/12/18  6:08 PM  Result Value Ref Range   Acetaminophen (Tylenol), Serum <10 (L) 10 - 30 ug/mL    Comment: (NOTE) Therapeutic concentrations vary significantly. A range of 10-30 ug/mL  may be an effective concentration for many patients. However, some  are best treated at concentrations outside of this range. Acetaminophen concentrations >150 ug/mL at 4 hours after ingestion  and >50 ug/mL at 12 hours after ingestion are often associated with  toxic reactions. Performed at [facility], [address], [place], [place] [address]   Salicylate level     Status: None   Collection Time: 11/12/18  6:08 PM  Result Value Ref Range   Salicylate Lvl <7.0 2.8 - 30.0 mg/dL    Comment: Performed at [facility], [address] [place], [place] [address]   Blood Alcohol level:  Lab Results  Component Value Date   ETH 128 (H) 11/12/2018   Metabolic Disorder Labs:  No results found for: HGBA1C, MPG No results found for: PROLACTIN No results found for: CHOL, TRIG, HDL, CHOLHDL, VLDL, LDLCALC  Current Medications: Current Facility-Administered Medications  Medication Dose Route Frequency Provider Last Rate Last Dose  . acetaminophen (TYLENOL) tablet 650 mg  650 mg Oral Q6H PRN Kerry Hough, PA-C      . alum & mag hydroxide-simeth (MAALOX/MYLANTA) 200-200-20 MG/5ML suspension 30 mL  30 mL Oral Q4H PRN Kerry Hough, PA-C      . FLUoxetine (PROZAC) capsule 20 mg  20 mg Oral Daily Kaidin Boehle I, NP      . hydrOXYzine (ATARAX/VISTARIL) tablet 25 mg  25 mg Oral Q6H PRN Donell Sievert E, PA-C      . loperamide (IMODIUM) capsule 2-4 mg  2-4 mg Oral PRN Kerry Hough, PA-C      . LORazepam (ATIVAN) tablet 1 mg  1 mg Oral Q6H PRN Kerry Hough, PA-C      . LORazepam (ATIVAN) tablet 1 mg  1 mg Oral QID Kerry Hough, PA-C   1 mg at 11/13/18 0815   Followed by  . [START ON 11/14/2018] LORazepam (ATIVAN)  tablet 1 mg  1 mg Oral TID Kerry Hough, PA-C       Followed by  . [START ON 11/15/2018] LORazepam (ATIVAN) tablet 1 mg  1 mg Oral BID Donell Sievert E, PA-C       Followed by  . [START ON 11/17/2018] LORazepam (ATIVAN) tablet 1 mg  1 mg Oral Daily Simon, Spencer E, PA-C      . magnesium hydroxide (MILK OF MAGNESIA) suspension 30 mL  30 mL Oral Daily PRN Kerry Hough, PA-C      . multivitamin with minerals tablet 1 tablet  1 tablet Oral Daily Kerry Hough, PA-C   1 tablet at 11/13/18 0815  . ondansetron (ZOFRAN-ODT) disintegrating tablet 4 mg  4 mg Oral Q6H PRN Donell Sievert E, PA-C      . thiamine (B-1) injection 100 mg  100 mg Intramuscular Once Kerry Hough, PA-C      . [START ON 11/14/2018] thiamine (VITAMIN B-1) tablet 100 mg  100 mg Oral Daily Simon, Spencer E, PA-C      . traZODone (DESYREL) tablet 50 mg  50 mg Oral QHS PRN,MR X 1 Simon, Spencer E, PA-C       PTA Medications: Medications Prior to Admission  Medication Sig Dispense Refill Last Dose  . ibuprofen (ADVIL,MOTRIN) 600 MG tablet Take 1 tablet (600 mg total) by mouth every 6 (six) hours as needed for pain. (Patient not taking: Reported on 11/12/2018) 30 tablet 0 Not Taking at Unknown time  . penicillin v potassium (VEETID) 500 MG tablet Take 1 tablet (500 mg total) by mouth 3 (three) times daily. (Patient not taking: Reported on 11/12/2018) 30 tablet 0 Not Taking at Unknown time  . traMADol (ULTRAM) 50 MG tablet Take 1 tablet (50 mg total) by mouth every 6 (six) hours as needed for pain. (Patient not taking: Reported on 11/12/2018) 12 tablet 0 Not Taking at Unknown time   Musculoskeletal: Strength & Muscle Tone: within normal limits Gait & Station: normal Patient leans: N/A  Psychiatric Specialty Exam: Physical Exam  Nursing note and vitals reviewed. Constitutional: He appears well-developed.  HENT:  Head: Normocephalic.  Eyes: Pupils are equal, round, and reactive to light.  Neck: Normal range of motion.   Cardiovascular: Normal rate.  Respiratory: Effort normal.  GI: Soft.  Genitourinary:    Genitourinary Comments: Deferred   Musculoskeletal: Normal range of motion.  Neurological: He is alert.  Skin: Skin is warm.    Review of Systems  Constitutional: Negative.   HENT: Negative.   Eyes: Negative.   Respiratory: Negative.   Cardiovascular: Negative.  Negative for chest pain and palpitations.  Gastrointestinal: Negative.  Negative for abdominal pain, heartburn, nausea and vomiting.  Genitourinary: Negative.   Musculoskeletal: Negative.   Skin: Negative.   Neurological: Negative.  Negative for dizziness and headaches.  Endo/Heme/Allergies: Negative.   Psychiatric/Behavioral: Positive for depression and substance abuse. Negative for hallucinations, memory loss and suicidal ideas. The patient is nervous/anxious and has insomnia.     Blood pressure 127/76, pulse 97, temperature 98.7 F (37.1 C), temperature source Oral, resp. rate 16, height 5\' 9"  (1.753 m), weight 86.2 kg.Body mass index is 28.06 kg/m.  General Appearance: Casual and Fairly Groomed  Eye Contact:  Good  Speech:  Clear and Coherent and Normal Rate  Volume:  Normal  Mood:  "I feel depressed a little about what my wife did with another man"  Affect:  Appropriate  Thought Process:  Coherent, Goal Directed and Descriptions of Associations: Intact  Orientation:  Full (Time, Place, and Person)  Thought Content:  Logical, denies any hallucinations, delusions or paranoia.  Suicidal Thoughts:  Currently denies any thoughts, plans or intent. Denies any hx of suicide attempts. Denies any suicide in the family  Homicidal Thoughts:  Denies  Memory:  Immediate;   Good Recent;   Good Remote;   Good  Judgement:  Fair  Insight:  Fair  Psychomotor Activity:  Normal, denies any tremors or restlessness. Admits mild anxiety for being here.  Concentration:  Concentration: Good and Attention Span: Good  Recall:  Good  Fund of  Knowledge:  Fair  Language:  Fair  Akathisia:  No  Handed:  Right  AIMS (if indicated):     Assets:  Communication Skills Desire for Improvement Physical Health Social Support  ADL's:  Intact  Cognition:  WNL  Sleep:  Number of Hours: 3.25(new admit)   Treatment Plan Summary: Daily contact with patient to assess and evaluate symptoms and progress in treatment and Medication management.  - Recommended for inpatient hospitalization.  - Will continue today 11/13/2018 plan as below except where it is noted.  Alcohol detox.     - Continue the Ativan detox protocols as already in progress.  Depression.     - Initiated Prozac 20 mg po daily.  Insomnia.     - Continue Trazodone 50 mg po prn Q hs, may repeat x 1.  Patient to participate in the group sessions, AA/NA meetings being offered & held on this unit.  Discharged disposition is ongoing.  Observation Level/Precautions:  15 minute checks  Laboratory:  Per ED  Psychotherapy: Group sessions  Medications: See [facility]    Consultations: As needed.   Discharge Concerns: safety, mood stability  Estimated LOS: 2-4 days  Other: Admit to the 300-hall   Physician Treatment Plan for Primary Diagnosis: Alcohol use disorder, moderate, dependence (HCC)  Long Term Goal(s): Improvement in symptoms so as ready for discharge  Short Term Goals: Ability to identify changes in lifestyle to reduce recurrence of condition will improve, Ability to verbalize feelings will improve and Ability to demonstrate self-control will improve  Physician Treatment Plan for Secondary Diagnosis: Principal Problem:   Alcohol use disorder, moderate, dependence (HCC) Active Problems:   MDD (major depressive disorder), recurrent episode, severe (HCC)  Long Term Goal(s): Improvement in symptoms so as ready for discharge  Short Term Goals: Ability to identify and develop effective coping behaviors will improve, Compliance with prescribed medications will improve and  Ability to identify triggers associated with substance abuse/mental health issues will improve  I certify that inpatient services furnished can reasonably be expected to improve the patient's condition.    Armandina Stammer, NP, PMHNP, FNP-BC 1/2/202010:42 AM

## 2018-11-13 NOTE — [facility] Suicide Risk Assessment (Signed)
[facility] Admission Suicide Risk Assessment   Nursing information obtained from:  Patient Demographic factors:  Male, Living alone Current Mental Status:  NA Loss Factors:  Loss of significant relationship Historical Factors:  Prior suicide attempts Risk Reduction Factors:  Employed, Positive coping skills or problem solving skills  Total Time spent with patient: 45 minutes Principal Problem: <principal problem not specified> Diagnosis:  Active Problems:   MDD (major depressive disorder), recurrent episode, severe (HCC)  Subjective Data: Patient at risk holding a knife to his throat, while intoxicated, after recent DUI charge and recently discovering infidelity with wife and best friend  Continued Clinical Symptoms:  Alcohol Use Disorder Identification Test Final Score (AUDIT): 7 The "Alcohol Use Disorders Identification Test", Guidelines for Use in Primary Care, Second Edition.  World Science writer [facility]). Score between 0-7:  no or low risk or alcohol related problems. Score between 8-15:  moderate risk of alcohol related problems. Score between 16-19:  high risk of alcohol related problems. Score 20 or above:  warrants further diagnostic evaluation for alcohol dependence and treatment.   CLINICAL FACTORS:   Alcohol/Substance Abuse/Dependencies  At present is alert oriented to person place time situation minimizing the recent events denies wanting to harm self now can contract here no psychosis no withdrawal symptoms noted    COGNITIVE FEATURES THAT CONTRIBUTE TO RISK:  None    SUICIDE RISK:   Moderate:  Frequent suicidal ideation with limited intensity, and duration, some specificity in terms of plans, no associated intent, good self-control, limited dysphoria/symptomatology, some risk factors present, and identifiable protective factors, including available and accessible social support.  PLAN OF CARE: Continue detox regimen continue to evaluate discussed with team begin  antidepressant if he agrees  I certify that inpatient services furnished can reasonably be expected to improve the patient's condition.   Malvin Johns, MD 11/13/2018, 9:20 AM

## 2018-11-13 NOTE — [facility] Group Notes (Signed)
LCSW Group Therapy Note  11/13/2018 1:15pm  Type of Therapy and Topic:  Group Therapy: Avoiding Self-Sabotaging and Enabling Behaviors  Participation Level:  Did Not Attend--pt invited. Chose to remain in bed.   Description of Group:   In this group, patients will learn how to identify obstacles, self-sabotaging and enabling behaviors, as well as: what are they, why do we do them and what needs these behaviors meet. Discuss unhealthy relationships and how to have positive healthy boundaries with those that sabotage and enable. Explore aspects of self-sabotage and enabling in yourself and how to limit these self-destructive behaviors in everyday life.   Therapeutic Goals: 1. Patient will identify one obstacle that relates to self-sabotage and enabling behaviors 2. Patient will identify one personal self-sabotaging or enabling behavior they did prior to admission 3. Patient will state a plan to change the above identified behavior 4. Patient will demonstrate ability to communicate their needs through discussion and/or role play.   Summary of Patient Progress:   x  Therapeutic Modalities:   Cognitive Behavioral Therapy Person-Centered Therapy Motivational Interviewing   Rona Ravens, LCSW 11/13/2018 1:52 PM

## 2018-11-13 NOTE — [facility] Counselor (Signed)
CSW made two attempts to meet with pt today in order to complete psychosocial assessment. Pt in bed sleeping and did not rouse when named called. CSW will try again tomorrow.  Toshiye Kever S. Alan Ripper, MSW, LCSW Clinical Social Worker 11/13/2018 1:53 PM

## 2018-11-13 NOTE — Progress Notes (Addendum)
Pt observe in the dayroom, seen interacting with peers and eating a snack. Pt attended wrap-up group this evening.Pt appears animated in affect and mood. Pt denies SI/HI/AVH/Pain at this time. Pt states he had a great visit with wife. Pt was superficial on approach; minimizing s/s. Pt is preoccupied about d/c and hopes to go home tomorrow. Pt states he needs to get back to work. Pt denies withdrawal s/s. C/o of insomnia HS.Support and encouragement offered. Will continue with POC.

## 2018-11-13 NOTE — Plan of Care (Signed)

## 2018-11-14 DIAGNOSIS — F1020 Alcohol dependence, uncomplicated: Secondary | ICD-10-CM

## 2018-11-14 DIAGNOSIS — F332 Major depressive disorder, recurrent severe without psychotic features: Principal | ICD-10-CM

## 2018-11-14 NOTE — [facility] Group Notes (Signed)
The focus of this group is to help patients establish daily goals to achieve during treatment and discuss how the patient can incorporate goal setting into their daily lives to aide in recovery.  PT did not attend 

## 2018-11-14 NOTE — Progress Notes (Signed)
Patient ID: Maurice Thomas, male   DOB: May 06, 1986, 33 y.o.   MRN: [id number]  Pt currently presents with a flat affect and appropriate behavior. Pt reports to Clinical research associate that their goal is to "get back to my normal life and work." Pt states "I found my wife with another man and I drank too much." Pt reports that he hopes to move to [place] when he is discharged through a work transfer. Pt reports good sleep with current medication regimen.   Pt provided with medications per providers orders. Pt's labs and vitals were monitored throughout the night. Pt supported emotionally and encouraged to express concerns and questions. Pt educated on medications and coping skills for anxiety, needs reinforcement.   Pt's safety ensured with 15 minute and environmental checks. Pt currently denies SI/HI and A/V hallucinations. Pt verbally agrees to seek staff if SI/HI or A/VH occurs and to consult with staff before acting on any harmful thoughts. Will continue POC.

## 2018-11-14 NOTE — Tx Team (Signed)
Interdisciplinary Treatment and Diagnostic Plan Update  11/14/2018 Time of Session: 9:00am Erikson Danzy MRN: [id number]  Principal Diagnosis: Alcohol use disorder, moderate, dependence ([place])  Secondary Diagnoses: Principal Problem:   Alcohol use disorder, moderate, dependence ([place]) Active Problems:   MDD (major depressive disorder), recurrent episode, severe ([place])   Current Medications:  Current Facility-Administered Medications  Medication Dose Route Frequency Provider Last Rate Last Dose  . acetaminophen (TYLENOL) tablet 650 mg  650 mg Oral Q6H PRN Laverle Hobby, PA-C      . alum & mag hydroxide-simeth (MAALOX/MYLANTA) 200-200-20 MG/5ML suspension 30 mL  30 mL Oral Q4H PRN Laverle Hobby, PA-C      . FLUoxetine (PROZAC) capsule 20 mg  20 mg Oral Daily Lindell Spar I, NP   20 mg at 11/14/18 0755  . hydrOXYzine (ATARAX/VISTARIL) tablet 25 mg  25 mg Oral Q6H PRN Patriciaann Clan E, PA-C      . loperamide (IMODIUM) capsule 2-4 mg  2-4 mg Oral PRN Laverle Hobby, PA-C      . LORazepam (ATIVAN) tablet 1 mg  1 mg Oral Q6H PRN Laverle Hobby, PA-C      . LORazepam (ATIVAN) tablet 1 mg  1 mg Oral QID Patriciaann Clan E, PA-C   1 mg at 11/14/18 0756   Followed by  . LORazepam (ATIVAN) tablet 1 mg  1 mg Oral TID Laverle Hobby, PA-C       Followed by  . [START ON 11/15/2018] LORazepam (ATIVAN) tablet 1 mg  1 mg Oral BID Patriciaann Clan E, PA-C       Followed by  . [START ON 11/17/2018] LORazepam (ATIVAN) tablet 1 mg  1 mg Oral Daily Simon, Spencer E, PA-C      . magnesium hydroxide (MILK OF MAGNESIA) suspension 30 mL  30 mL Oral Daily PRN Laverle Hobby, PA-C      . multivitamin with minerals tablet 1 tablet  1 tablet Oral Daily Laverle Hobby, PA-C   1 tablet at 11/14/18 0755  . ondansetron (ZOFRAN-ODT) disintegrating tablet 4 mg  4 mg Oral Q6H PRN Patriciaann Clan E, PA-C      . thiamine (B-1) injection 100 mg  100 mg Intramuscular Once Patriciaann Clan E, PA-C      . thiamine (VITAMIN  B-1) tablet 100 mg  100 mg Oral Daily Patriciaann Clan E, PA-C   100 mg at 11/14/18 0755  . traZODone (DESYREL) tablet 50 mg  50 mg Oral QHS PRN,MR X 1 Laverle Hobby, PA-C   50 mg at 11/13/18 2126   PTA Medications: Medications Prior to Admission  Medication Sig Dispense Refill Last Dose  . ibuprofen (ADVIL,MOTRIN) 600 MG tablet Take 1 tablet (600 mg total) by mouth every 6 (six) hours as needed for pain. (Patient not taking: Reported on 11/12/2018) 30 tablet 0 Not Taking at Unknown time  . penicillin v potassium (VEETID) 500 MG tablet Take 1 tablet (500 mg total) by mouth 3 (three) times daily. (Patient not taking: Reported on 11/12/2018) 30 tablet 0 Not Taking at Unknown time  . traMADol (ULTRAM) 50 MG tablet Take 1 tablet (50 mg total) by mouth every 6 (six) hours as needed for pain. (Patient not taking: Reported on 11/12/2018) 12 tablet 0 Not Taking at Unknown time    Patient Stressors: Marital or family conflict Substance abuse  Patient Strengths: General fund of knowledge Work skills  Treatment Modalities: Medication Management, Group therapy, Case management,  1 to 1 session  with clinician, Psychoeducation, Recreational therapy.   Physician Treatment Plan for Primary Diagnosis: Alcohol use disorder, moderate, dependence ([place]) Long Term Goal(s): Improvement in symptoms so as ready for discharge Improvement in symptoms so as ready for discharge   Short Term Goals: Ability to identify changes in lifestyle to reduce recurrence of condition will improve Ability to verbalize feelings will improve Ability to demonstrate self-control will improve Ability to identify and develop effective coping behaviors will improve Compliance with prescribed medications will improve Ability to identify triggers associated with substance abuse/mental health issues will improve  Medication Management: Evaluate patient's response, side effects, and tolerance of medication regimen.  Therapeutic  Interventions: 1 to 1 sessions, Unit Group sessions and Medication administration.  Evaluation of Outcomes: Not Met  Physician Treatment Plan for Secondary Diagnosis: Principal Problem:   Alcohol use disorder, moderate, dependence ([place]) Active Problems:   MDD (major depressive disorder), recurrent episode, severe ([place])  Long Term Goal(s): Improvement in symptoms so as ready for discharge Improvement in symptoms so as ready for discharge   Short Term Goals: Ability to identify changes in lifestyle to reduce recurrence of condition will improve Ability to verbalize feelings will improve Ability to demonstrate self-control will improve Ability to identify and develop effective coping behaviors will improve Compliance with prescribed medications will improve Ability to identify triggers associated with substance abuse/mental health issues will improve     Medication Management: Evaluate patient's response, side effects, and tolerance of medication regimen.  Therapeutic Interventions: 1 to 1 sessions, Unit Group sessions and Medication administration.  Evaluation of Outcomes: Not Met   RN Treatment Plan for Primary Diagnosis: Alcohol use disorder, moderate, dependence ([place]) Long Term Goal(s): Knowledge of disease and therapeutic regimen to maintain health will improve  Short Term Goals: Ability to verbalize frustration and anger appropriately will improve, Ability to verbalize feelings will improve, Ability to disclose and discuss suicidal ideas, Ability to identify and develop effective coping behaviors will improve and Compliance with prescribed medications will improve  Medication Management: RN will administer medications as ordered by provider, will assess and evaluate patient's response and provide education to patient for prescribed medication. RN will report any adverse and/or side effects to prescribing provider.  Therapeutic Interventions: 1 on 1 counseling sessions,  Psychoeducation, Medication administration, Evaluate responses to treatment, Monitor vital signs and CBGs as ordered, Perform/monitor CIWA, COWS, AIMS and Fall Risk screenings as ordered, Perform wound care treatments as ordered.  Evaluation of Outcomes: Not Met   LCSW Treatment Plan for Primary Diagnosis: Alcohol use disorder, moderate, dependence ([place]) Long Term Goal(s): Safe transition to appropriate next level of care at discharge, Engage patient in therapeutic group addressing interpersonal concerns.  Short Term Goals: Engage patient in aftercare planning with referrals and resources, Increase social support, Increase emotional regulation, Facilitate patient progression through stages of change regarding substance use diagnoses and concerns, Identify triggers associated with mental health/substance abuse issues and Increase skills for wellness and recovery  Therapeutic Interventions: Assess for all discharge needs, 1 to 1 time with Social worker, Explore available resources and support systems, Assess for adequacy in community support network, Educate family and significant other(s) on suicide prevention, Complete Psychosocial Assessment, Interpersonal group therapy.  Evaluation of Outcomes: Not Met   Progress in Treatment: Attending groups: Yes. Participating in groups: Yes. Taking medication as prescribed: Yes. Toleration medication: Yes. Family/Significant other contact made: No, will contact:  supports if consent is granted Patient understands diagnosis: Yes. Discussing patient identified problems/goals with staff: No. Medical problems stabilized  or resolved: No. Denies suicidal/homicidal ideation: Yes. Issues/concerns per patient self-inventory: No.  New problem(s) identified: Yes, Describe:  not sure of discharge living arrangements; plans to leave his wife.  New Short Term/Long Term Goal(s): detox, medication management for mood stabilization; elimination of SI thoughts;  development of comprehensive mental wellness/sobriety plan.  Patient Goals: "Nothing." Discharge.  Discharge Plan or Barriers: [place] pamphlet, Mobile Crisis information, and AA/NA information provided to patient for additional community support and resources.   Reason for Continuation of Hospitalization: Aggression Anxiety Depression Medical Issues Withdrawal symptoms  Estimated Length of Stay:  Attendees: Patient: 11/14/2018 10:12 AM  Physician:  11/14/2018 10:12 AM  Nursing:  11/14/2018 10:12 AM  RN Care Manager: 11/14/2018 10:12 AM  Social Worker: Stephanie Acre, [place] 11/14/2018 10:12 AM  Recreational Therapist:  11/14/2018 10:12 AM  Other:  11/14/2018 10:12 AM  Other:  11/14/2018 10:12 AM  Other: 11/14/2018 10:12 AM    Scribe for Treatment Team: Joellen Jersey, [place] 11/14/2018 10:12 AM
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
## 2018-11-14 NOTE — Progress Notes (Signed)
Pt observe in the dayroom, attending AA meeting.Pt appears animated/silly in affect and mood. Pt denies SI/HI/AVH/Pain at this time. Pt states he had a great visit with father Pt was superficial on approach; minimizing s/s. Pt is preoccupied about d/c and hopes to go home tomorrow. Pt states he needs to get back to work. Pt denies withdrawal s/s. C/o of insomnia HS.Support and encouragement offered. Will continue with POC.

## 2018-11-15 MED ORDER — FLUOXETINE HCL 20 MG PO CAPS: 20.0000 mg | ORAL_CAPSULE | Freq: Every day | ORAL | 0 refills | Status: AC

## 2018-11-15 MED ORDER — TRAZODONE HCL 50 MG PO TABS: 50.0000 mg | ORAL_TABLET | Freq: Every evening | ORAL | 0 refills | Status: AC | PRN

## 2018-11-15 NOTE — Progress Notes (Signed)
  [facility] Adult Case Management Discharge Plan :  Will you be returning to the same living situation after discharge:  Yes,  with girlfriend At discharge, do you have transportation home?: Yes,  girlfriend Do you have the ability to pay for your medications: Yes,  insurance and income  Release of information consent forms completed and turned in to Medical Records by CSW.   Patient to Follow up at: Follow-up Information    [facility] Follow up on 11/17/2018.   Why:  Please go to the [facility] which is open Monday-Friday from 8:00am-3:00pm.  They take your insurance and can provide medication management and therapy.  The first day will likely be an all-day event.  Take your ID and insurance card with you. Contact information: [address] [place] [address] [phone]        [facility] Follow up.   Why:  If you decide you need assistance to stop smoking, please call and self-refer for this free assistance. Contact information: 1-800-QUIT-NOW          Next level of care provider has access to [facility] Link:no  Safety Planning and Suicide Prevention discussed: No.  Declined by pt, discussed only with him  Have you used any form of tobacco in the last 30 days? (Cigarettes, Smokeless Tobacco, Cigars, and/or Pipes): Yes  Has patient been referred to the [facility]?:   Yes, for self-referral only  Patient has been referred for addiction treatment: Yes  Lynnell Chad, LCSW 11/15/2018, 11:22 AM

## 2018-11-15 NOTE — Discharge Summary (Signed)
Physician Discharge Summary Note  Patient:  Maurice Thomas is an 33 y.o., male MRN:  [id number] DOB:  [birth year] Patient phone:  [phone] (home)  Patient address:   [address] [place] [address],  Total Time spent with patient: 15 minutes  Date of Admission:  11/13/2018 Date of Discharge: 11/15/2018  Reason for Admission:  Per assessment note:This is an admission assessment for Joriel, a 33 year old Hispanic male. Admitted to the [facility] under an IVC petition for threatening suicide by holding a knife to his throat. Chart review reports that patient reported at the ED that he found his wife cheating with another man. He has been a patient here at [facility] in 2007. After his medical evaluation/clearance, patient was brought to the [facility] for mental health evaluation & alcohol detoxification treatments. His BAL on admission was 128. He is started on the Ativan detoxification treatment protocols for alcohol detox.During this assessment, Aadit reports, "My parents took me to the [facility] ED yesterday. I told my dad that I was going to kill myself with a knife. I held the knife on my throat. The reason for all these is because I found my wife cheating with another man. This is my first attempt & threat to hurt myself. I was not depressed prior to finding my wife with another man. After I caught my wife with another man, I drank a half-bottle of alcohol yesterday. I have not been drinking a lot prior to this. But, I'm feeling a lot better today than when I was brought to the ED. I used to take depression medicine in the past. I cannot remember the name of that medicine. I have not been on depression medicine in a long time. I'm willing to try depression medicine again. I don't know how you guys can help me because I'm going to split from my wife. I need to go back to work please".  Principal Problem: Alcohol use disorder, moderate, dependence (HCC) Discharge Diagnoses: Principal Problem:   Alcohol use  disorder, moderate, dependence (HCC) Active Problems:   MDD (major depressive disorder), recurrent episode, severe (HCC)   Past Psychiatric History:  Depession, EToH   Past Medical History: History reviewed. No pertinent past medical history. History reviewed. No pertinent surgical history. Family History: History reviewed. No pertinent family history. Family Psychiatric  History:  Social History:  Social History   Substance and Sexual Activity  Alcohol Use Yes     Social History   Substance and Sexual Activity  Drug Use No    Social History   Socioeconomic History  . Marital status: Married    Spouse name: Not on file  . Number of children: Not on file  . Years of education: Not on file  . Highest education level: Not on file  Occupational History  . Not on file  Social Needs  . Financial resource strain: Not on file  . Food insecurity:    Worry: Not on file    Inability: Not on file  . Transportation needs:    Medical: Not on file    Non-medical: Not on file  Tobacco Use  . Smoking status: Former Smoker    Packs/day: 1.00    Years: 6.00    Pack years: 6.00    Types: Cigarettes  . Smokeless tobacco: Never Used  Substance and Sexual Activity  . Alcohol use: Yes  . Drug use: No  . Sexual activity: Never  Lifestyle  . Physical activity:    Days  per week: Not on file    Minutes per session: Not on file  . Stress: Not on file  Relationships  . Social connections:    Talks on phone: Not on file    Gets together: Not on file    Attends religious service: Not on file    Active member of club or organization: Not on file    Attends meetings of clubs or organizations: Not on file    Relationship status: Not on file  Other Topics Concern  . Not on file  Social History Narrative  . Not on file    Hospital Course:  James IvanoffCarlos Aikman was admitted for Alcohol use disorder, moderate, dependence (HCC) and crisis management.  Pt was treated discharged with the  medications listed below under Medication List.  Medical problems were identified and treated as needed.  Home medications were restarted as appropriate.  Improvement was monitored by observation and James Ivanoffarlos Litton 's daily report of symptom reduction.  Emotional and mental status was monitored by daily self-inventory reports completed by James Ivanoffarlos Lauver and clinical staff.         James IvanoffCarlos Califano was evaluated by the treatment team for stability and plans for continued recovery upon discharge. James IvanoffCarlos Harland 's motivation was an integral factor for scheduling further treatment. Employment, transportation, bed availability, health status, family support, and any pending legal issues were also considered during hospital stay. Pt was offered further treatment options upon discharge including but not limited to Residential, Intensive Outpatient, and Outpatient treatment.  James IvanoffCarlos Gill will follow up with the services as listed below under Follow Up Information.     Upon completion of this admission the patient was both mentally and medically stable for discharge denying suicidal and homicidal ideations. delusional thoughts and paranoia.    James Ivanoffarlos Graumann responded well to treatment with Prozac 20mg  and Trazodone 50 mg without adverse effects.  Pt demonstrated improvement without reported or observed adverse effects to the point of stability appropriate for outpatient management. Pertinent labs include CMP and CBC, WBC elevated 12.9 for which outpatient follow-up is necessary for lab recheck as mentioned below. Reviewed CBC, CMP, BAL, and UDS; all unremarkable aside from noted exceptions.   Physical Findings: AIMS:  , ,  ,  ,    CIWA:  CIWA-Ar Total: 0 COWS:     Musculoskeletal: Strength & Muscle Tone: within normal limits Gait & Station: normal Patient leans: N/A  Psychiatric Specialty Exam: See SRA by MD Physical Exam  Nursing note and vitals reviewed. Constitutional: He appears well-developed.   Psychiatric: He has a normal mood and affect. His behavior is normal.    Review of Systems  Psychiatric/Behavioral: Negative for depression (denied, reported symptoms has imporved ), substance abuse and suicidal ideas. The patient is not nervous/anxious.   All other systems reviewed and are negative.   Blood pressure 135/88, pulse 98, temperature 98.7 F (37.1 C), temperature source Oral, resp. rate 16, height 5\' 9"  (1.753 m), weight 86.2 kg.Body mass index is 28.06 kg/m.   Have you used any form of tobacco in the last 30 days? (Cigarettes, Smokeless Tobacco, Cigars, and/or Pipes): Yes  Has this patient used any form of tobacco in the last 30 days? (Cigarettes, Smokeless Tobacco, Cigars, and/or Pipes) Yes, Yes, A prescription for an FDA-approved tobacco cessation medication was offered at discharge and the patient refused  Blood Alcohol level:  Lab Results  Component Value Date   ETH 128 (H) 11/12/2018    Metabolic Disorder Labs:  No  results found for: HGBA1C, MPG No results found for: PROLACTIN No results found for: CHOL, TRIG, HDL, CHOLHDL, VLDL, LDLCALC  See Psychiatric Specialty Exam and Suicide Risk Assessment completed by Attending Physician prior to discharge.  Discharge destination:  Home  Is patient on multiple antipsychotic therapies at discharge:  No   Has Patient had three or more failed trials of antipsychotic monotherapy by history:  No  Recommended Plan for Multiple Antipsychotic Therapies: NA  Discharge Instructions    Diet - low sodium heart healthy   Complete by:  As directed    Discharge instructions   Complete by:  As directed    Take all medications as prescribed. Keep all follow-up appointments as scheduled.  Do not consume alcohol or use illegal drugs while on prescription medications. Report any adverse effects from your medications to your primary care provider promptly.  In the event of recurrent symptoms or worsening symptoms, call 911, a crisis  hotline, or go to the nearest emergency department for evaluation.   Increase activity slowly   Complete by:  As directed      Allergies as of 11/15/2018   No Known Allergies     Medication List    STOP taking these medications   ibuprofen 600 MG tablet Commonly known as:  ADVIL,MOTRIN   penicillin v potassium 500 MG tablet Commonly known as:  VEETID   traMADol 50 MG tablet Commonly known as:  ULTRAM     TAKE these medications     Indication  FLUoxetine 20 MG capsule Commonly known as:  PROZAC Take 1 capsule (20 mg total) by mouth daily. Start taking on:  November 16, 2018  Indication:  Major Depressive Disorder   traZODone 50 MG tablet Commonly known as:  DESYREL Take 1 tablet (50 mg total) by mouth at bedtime as needed and may repeat dose one time if needed for sleep.  Indication:  Trouble Sleeping        Follow-up recommendations:  Activity:  as tolerated Diet:  heart healthy  Comments:  Take all medications as prescribed. Keep all follow-up appointments as scheduled.  Do not consume alcohol or use illegal drugs while on prescription medications. Report any adverse effects from your medications to your primary care provider promptly.  In the event of recurrent symptoms or worsening symptoms, call 911, a crisis hotline, or go to the nearest emergency department for evaluation.   Signed: Oneta Rackanika N Mikella Linsley, NP 11/15/2018, 9:01 AM

## 2018-11-15 NOTE — Progress Notes (Signed)
Pt discharged to lobby. Pt was stable and appreciative at that time. All papers and prescriptions were given and valuables returned. Verbal understanding expressed. Denies SI/HI and A/VH. Pt given opportunity to express concerns and ask questions.  

## 2018-11-15 NOTE — Progress Notes (Signed)
D. Pt pleasant and friendly upon approach- expressing a desire for discharge- stating he's "looking forward to getting back to work". Pt rates his depression, hopelessness and anxiety a 1/0/1, respectively. Pt currently denies pain, withdrawal symptoms and SI/HI and A/VH. Pt reports that he was drunk when expressing SI.  A . Labs and vitals monitored. Pt compliant with medications. Pt supported emotionally and encouraged to express concerns and ask questions.   R. Pt remains safe with 15 minute checks. Will continue POC.
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
## 2018-11-15 NOTE — [facility] Suicide Risk Assessment (Signed)
[facility] INPATIENT:  Family/Significant Other Suicide Prevention Education  Suicide Prevention Education:  Patient Refusal for Family/Significant Other Suicide Prevention Education: The patient Tokio Sobalvarro has refused to provide written consent for family/significant other to be provided Family/Significant Other Suicide Prevention Education during admission and/or prior to discharge.  Physician notified.  Suicide Prevention Education was reviewed thoroughly with patient, including risk factors, warning signs, and what to do.  Mobile Crisis services were described and that telephone number pointed out, with encouragement to patient to put this number in personal cell phone.  Brochure was provided to patient to share with natural supports.  Patient acknowledged the ways in which they are at risk, and how working through each of their issues can gradually start to reduce their risk factors.  Patient was encouraged to think of the information in the context of people in their own lives.  Patient denied having access to firearms  Patient verbalized understanding of information provided.  Patient endorsed a desire to live.      Carloyn Jaeger Grossman-Orr 11/15/2018, 11:21 AM

## 2018-11-15 NOTE — [facility] Suicide Risk Assessment (Signed)
[facility] Discharge Suicide Risk Assessment   Principal Problem: Alcohol use disorder, moderate, dependence (HCC) Discharge Diagnoses: Principal Problem:   Alcohol use disorder, moderate, dependence (HCC) Active Problems:   MDD (major depressive disorder), recurrent episode, severe (HCC)   Total Time spent with patient: 15 minutes  Musculoskeletal: Strength & Muscle Tone: within normal limits Gait & Station: normal Patient leans: N/A  Psychiatric Specialty Exam: Review of Systems  All other systems reviewed and are negative.   Blood pressure 111/67, pulse 77, temperature 97.9 F (36.6 C), resp. rate 20, height 5\' 9"  (1.753 m), weight 86.2 kg.Body mass index is 28.06 kg/m.  General Appearance: Casual  Eye Contact::  Good  Speech:  Normal Rate409  Volume:  Normal  Mood:  Euthymic  Affect:  Congruent  Thought Process:  Coherent and Descriptions of Associations: Intact  Orientation:  Full (Time, Place, and Person)  Thought Content:  Logical  Suicidal Thoughts:  No  Homicidal Thoughts:  No  Memory:  Immediate;   Fair Recent;   Fair Remote;   Fair  Judgement:  Intact  Insight:  Fair  Psychomotor Activity:  Normal  Concentration:  Fair  Recall:  [employer] of Knowledge:Fair  Language: Fair  Akathisia:  Negative  Handed:  Right  AIMS (if indicated):     Assets:  Desire for Improvement Housing Physical Health Resilience  Sleep:  Number of Hours: 6.5  Cognition: WNL  ADL's:  Intact   Mental Status Per Nursing Assessment::   On Admission:  NA  Demographic Factors:  Male and Low socioeconomic status  Loss Factors: Financial problems/change in socioeconomic status  Historical Factors: Impulsivity  Risk Reduction Factors:   Sense of responsibility to family, Positive social support and Positive therapeutic relationship  Continued Clinical Symptoms:  Depression:   Comorbid alcohol abuse/dependence Impulsivity Alcohol/Substance Abuse/Dependencies  Cognitive  Features That Contribute To Risk:  None    Suicide Risk:  Minimal: No identifiable suicidal ideation.  Patients presenting with no risk factors but with morbid ruminations; may be classified as minimal risk based on the severity of the depressive symptoms  Follow-up Information    [facility] Follow up on 11/17/2018.   Why:  Please go to the [facility] which is open Monday-Friday from 8:00am-3:00pm.  They take your insurance and can provide medication management and therapy.  The first day will likely be an all-day event.  Take your ID and insurance card with you. Contact information: [address] [place] [address] [phone]        [facility] Follow up.   Why:  If you decide you need assistance to stop smoking, please call and self-refer for this free assistance. Contact information: 1-800-QUIT-NOW          Plan Of Care/Follow-up recommendations:  Activity:  ad lib  Antonieta Pert, MD 11/15/2018, 12:25 PM

## 2022-12-06 ENCOUNTER — Emergency Department (HOSPITAL_COMMUNITY): Payer: 59

## 2022-12-06 ENCOUNTER — Other Ambulatory Visit: Payer: Self-pay

## 2022-12-06 ENCOUNTER — Emergency Department (HOSPITAL_COMMUNITY)
Admission: EM | Admit: 2022-12-06 | Discharge: 2022-12-06 | Discharge status: Against Medical Advice | Payer: 59 | Attending: Emergency Medicine | Admitting: Emergency Medicine

## 2022-12-06 DIAGNOSIS — R0789 Other chest pain: Secondary | ICD-10-CM | POA: Insufficient documentation

## 2022-12-06 DIAGNOSIS — R61 Generalized hyperhidrosis: Secondary | ICD-10-CM | POA: Insufficient documentation

## 2022-12-06 DIAGNOSIS — R0602 Shortness of breath: Secondary | ICD-10-CM | POA: Insufficient documentation

## 2022-12-06 DIAGNOSIS — Z5321 Procedure and treatment not carried out due to patient leaving prior to being seen by health care provider: Secondary | ICD-10-CM | POA: Insufficient documentation

## 2022-12-06 LAB — BASIC METABOLIC PANEL
Anion gap: 9 (ref 5–15)
BUN: 15 mg/dL (ref 6–20)
CO2: 23 mmol/L (ref 22–32)
Calcium: 9.5 mg/dL (ref 8.9–10.3)
Chloride: 104 mmol/L (ref 98–111)
Creatinine, Ser: 0.93 mg/dL (ref 0.61–1.24)
GFR, Estimated: >60 mL/min (ref >60)
Glucose, Bld: 91 mg/dL (ref 70–99)
Potassium: 3.9 mmol/L (ref 3.5–5.1)
Sodium: 136 mmol/L (ref 135–145)

## 2022-12-06 LAB — TROPONIN I (HIGH SENSITIVITY)
Troponin I (High Sensitivity): 3 ng/L (ref <18)
Troponin I (High Sensitivity): 3 ng/L (ref <18)

## 2022-12-06 LAB — CBC
HCT: 55.2 % — ABNORMAL HIGH (ref 39.0–52.0)
Hemoglobin: 17.6 g/dL — ABNORMAL HIGH (ref 13.0–17.0)
MCH: 28.9 pg (ref 26.0–34.0)
MCHC: 31.9 g/dL (ref 30.0–36.0)
MCV: 90.8 fL (ref 80.0–100.0)
Platelets: 471 10*3/uL — ABNORMAL HIGH (ref 150–400)
RBC: 6.08 MIL/uL — ABNORMAL HIGH (ref 4.22–5.81)
RDW: 13.6 % (ref 11.5–15.5)
WBC: 9.6 10*3/uL (ref 4.0–10.5)
nRBC: 0 % (ref 0.0–0.2)

## 2022-12-06 NOTE — ED Notes (Signed)
Pt called for room x3 no answer 

## 2022-12-06 NOTE — ED Provider Triage Note (Signed)
Emergency Medicine Provider Triage Evaluation Note  Richar Dunklee , a 37 y.o. male  was evaluated in triage.  Pt complains of chest pain.  Started late last night while sleeping.  Located in his left chest.  Feels sharp and radiates down his left arm at times.  It is nonreproducible.  Endorses associated shortness of breath.  Chest pain is worse with exertion.  States has had intermittent chest pain for the last month.  Was prescribed nitro.  Review of Systems  Positive: See above Negative: See above  Physical Exam  BP (!) 147/98 (BP Location: Right Arm)   Pulse (!) 106   Temp 98.6 F (37 C) (Oral)   Resp (!) 32   SpO2 99%  Gen:   Awake, no distress Resp:  Normal effort MSK:   Moves extremities without difficulty  Other:    Medical Decision Making  Medically screening exam initiated at 9:20 AM.  Appropriate orders placed.  Jashua Knaak was informed that the remainder of the evaluation will be completed by another provider, this initial triage assessment does not replace that evaluation, and the importance of remaining in the ED until their evaluation is complete.  Work up started   Harriet Pho, [place] 12/06/22 [phone]

## 2022-12-06 NOTE — ED Notes (Signed)
Did not answer for vitals

## 2022-12-06 NOTE — ED Triage Notes (Signed)
Pt endorses central CP that radiates down left arm since yesterday. Pt very diaphoretic. Pt states he has cardiology appt next week.

## 2022-12-06 NOTE — ED Notes (Signed)
Patient transported to X-ray 

## 2023-04-11 ENCOUNTER — Encounter (HOSPITAL_COMMUNITY): Payer: Self-pay

## 2023-04-11 ENCOUNTER — Other Ambulatory Visit: Payer: Self-pay

## 2023-04-11 ENCOUNTER — Emergency Department (HOSPITAL_COMMUNITY): Payer: 59

## 2023-04-11 ENCOUNTER — Emergency Department (HOSPITAL_COMMUNITY)
Admission: EM | Admit: 2023-04-11 | Discharge: 2023-04-12 | Disposition: A | Discharge status: Home / Self Care | Payer: 59 | Attending: Emergency Medicine | Admitting: Emergency Medicine

## 2023-04-11 DIAGNOSIS — R0602 Shortness of breath: Secondary | ICD-10-CM | POA: Insufficient documentation

## 2023-04-11 DIAGNOSIS — Z5321 Procedure and treatment not carried out due to patient leaving prior to being seen by health care provider: Secondary | ICD-10-CM | POA: Diagnosis not present

## 2023-04-11 DIAGNOSIS — R0789 Other chest pain: Secondary | ICD-10-CM | POA: Insufficient documentation

## 2023-04-11 LAB — BASIC METABOLIC PANEL
Anion gap: 14 (ref 5–15)
BUN: 15 mg/dL (ref 6–20)
CO2: 23 mmol/L (ref 22–32)
Calcium: 9.8 mg/dL (ref 8.9–10.3)
Chloride: 101 mmol/L (ref 98–111)
Creatinine, Ser: 0.93 mg/dL (ref 0.61–1.24)
GFR, Estimated: >60 mL/min (ref >60)
Glucose, Bld: 97 mg/dL (ref 70–99)
Potassium: 3.7 mmol/L (ref 3.5–5.1)
Sodium: 138 mmol/L (ref 135–145)

## 2023-04-11 LAB — CBC
HCT: 47.9 % (ref 39.0–52.0)
Hemoglobin: 16.3 g/dL (ref 13.0–17.0)
MCH: 29.5 pg (ref 26.0–34.0)
MCHC: 34 g/dL (ref 30.0–36.0)
MCV: 86.8 fL (ref 80.0–100.0)
Platelets: 407 10*3/uL — ABNORMAL HIGH (ref 150–400)
RBC: 5.52 MIL/uL (ref 4.22–5.81)
RDW: 13.3 % (ref 11.5–15.5)
WBC: 10.6 10*3/uL — ABNORMAL HIGH (ref 4.0–10.5)
nRBC: 0 % (ref 0.0–0.2)

## 2023-04-11 LAB — TROPONIN I (HIGH SENSITIVITY): Troponin I (High Sensitivity): 4 ng/L (ref <18)

## 2023-04-11 NOTE — ED Triage Notes (Signed)
Pt began having central tight chest pain and SOB early this morning. Pain worse with inspiration. Pain radiates to right arm. Denies dizziness. Pain is worse with exertion

## 2023-04-12 NOTE — ED Notes (Addendum)
Pt called multiple times no answer
